# Patient Record
Sex: Male | Born: 1960 | Race: White | Hispanic: No | Marital: Single
Health system: Southern US, Community
[De-identification: ages and names within clinical notes are randomized; demographics above are authoritative.]

---

## 2009-10-20 ENCOUNTER — Inpatient Hospital Stay: Payer: Self-pay | Admitting: Internal Medicine

## 2009-11-26 ENCOUNTER — Ambulatory Visit: Payer: Self-pay | Admitting: Specialist

## 2010-01-06 ENCOUNTER — Ambulatory Visit: Payer: Self-pay | Admitting: Surgery

## 2010-01-31 ENCOUNTER — Ambulatory Visit: Payer: Self-pay | Admitting: Surgery

## 2010-12-08 ENCOUNTER — Ambulatory Visit: Payer: Self-pay | Admitting: Ophthalmology

## 2011-07-11 ENCOUNTER — Emergency Department: Payer: Self-pay | Admitting: Emergency Medicine

## 2011-07-11 LAB — BASIC METABOLIC PANEL
BUN: 38 mg/dL — ABNORMAL HIGH (ref 7–18)
Calcium, Total: 8.5 mg/dL (ref 8.5–10.1)
Co2: 25 mmol/L (ref 21–32)
Creatinine: 1.71 mg/dL — ABNORMAL HIGH (ref 0.60–1.30)
EGFR (Non-African Amer.): 45 — ABNORMAL LOW
Osmolality: 296 (ref 275–301)
Potassium: 4.9 mmol/L (ref 3.5–5.1)
Sodium: 132 mmol/L — ABNORMAL LOW (ref 136–145)

## 2011-07-11 LAB — URINALYSIS, COMPLETE
Bacteria: NONE SEEN
Glucose,UR: >=500 mg/dL (ref 0–75)
Ketone: NEGATIVE
Leukocyte Esterase: NEGATIVE
Nitrite: NEGATIVE
Protein: 100
Specific Gravity: 1.027 (ref 1.003–1.030)
WBC UR: <1 /HPF (ref 0–5)

## 2011-07-11 LAB — CBC
HGB: 13.6 g/dL (ref 13.0–18.0)
MCV: 79 fL — ABNORMAL LOW (ref 80–100)
Platelet: 266 10*3/uL (ref 150–440)
RDW: 14.5 % (ref 11.5–14.5)
WBC: 10.5 10*3/uL (ref 3.8–10.6)

## 2011-10-07 ENCOUNTER — Ambulatory Visit: Payer: Self-pay | Admitting: Internal Medicine

## 2011-10-20 ENCOUNTER — Ambulatory Visit: Payer: Self-pay | Admitting: Nephrology

## 2012-01-26 ENCOUNTER — Ambulatory Visit: Payer: Self-pay | Admitting: Pain Medicine

## 2012-02-06 ENCOUNTER — Emergency Department: Payer: Self-pay | Admitting: Emergency Medicine

## 2012-04-10 ENCOUNTER — Inpatient Hospital Stay: Payer: Self-pay | Admitting: Urology

## 2012-04-10 LAB — URINALYSIS, COMPLETE
Hyaline Cast: 7
Nitrite: NEGATIVE
Protein: >=500
RBC,UR: 4 /HPF (ref 0–5)
Specific Gravity: 1.013 (ref 1.003–1.030)

## 2012-04-10 LAB — CBC
HGB: 10.6 g/dL — ABNORMAL LOW (ref 13.0–18.0)
MCH: 25 pg — ABNORMAL LOW (ref 26.0–34.0)
MCV: 79 fL — ABNORMAL LOW (ref 80–100)
RBC: 4.22 10*6/uL — ABNORMAL LOW (ref 4.40–5.90)

## 2012-04-10 LAB — COMPREHENSIVE METABOLIC PANEL
Alkaline Phosphatase: 417 U/L — ABNORMAL HIGH (ref 50–136)
BUN: 46 mg/dL — ABNORMAL HIGH (ref 7–18)
Chloride: 101 mmol/L (ref 98–107)
Co2: 24 mmol/L (ref 21–32)
Creatinine: 3.83 mg/dL — ABNORMAL HIGH (ref 0.60–1.30)
Osmolality: 295 (ref 275–301)
Potassium: 3.6 mmol/L (ref 3.5–5.1)
SGOT(AST): 46 U/L — ABNORMAL HIGH (ref 15–37)
SGPT (ALT): 40 U/L (ref 12–78)
Sodium: 136 mmol/L (ref 136–145)
Total Protein: 7.6 g/dL (ref 6.4–8.2)

## 2012-04-10 LAB — PROTIME-INR
INR: 1.1
Prothrombin Time: 15 secs — ABNORMAL HIGH (ref 11.5–14.7)

## 2012-04-10 LAB — LIPASE, BLOOD: Lipase: 169 U/L (ref 73–393)

## 2012-04-11 LAB — BASIC METABOLIC PANEL
Anion Gap: 11 (ref 7–16)
BUN: 39 mg/dL — ABNORMAL HIGH (ref 7–18)
Calcium, Total: 6.3 mg/dL — CL (ref 8.5–10.1)
Chloride: 108 mmol/L — ABNORMAL HIGH (ref 98–107)
Co2: 20 mmol/L — ABNORMAL LOW (ref 21–32)
Creatinine: 3.73 mg/dL — ABNORMAL HIGH (ref 0.60–1.30)
EGFR (African American): 20 — ABNORMAL LOW
Sodium: 139 mmol/L (ref 136–145)

## 2012-04-11 LAB — CBC WITH DIFFERENTIAL/PLATELET
Basophil %: 0.4 %
Eosinophil #: 0.4 10*3/uL (ref 0.0–0.7)
Eosinophil %: 2.4 %
HCT: 26.7 % — ABNORMAL LOW (ref 40.0–52.0)
Lymphocyte %: 8 %
MCHC: 33.1 g/dL (ref 32.0–36.0)
Monocyte %: 10 %
Neutrophil #: 14 10*3/uL — ABNORMAL HIGH (ref 1.4–6.5)
Neutrophil %: 79.2 %
RBC: 3.38 10*6/uL — ABNORMAL LOW (ref 4.40–5.90)

## 2012-04-11 LAB — HEPATIC FUNCTION PANEL A (ARMC)
Albumin: 1.2 g/dL — ABNORMAL LOW (ref 3.4–5.0)
Alkaline Phosphatase: 327 U/L — ABNORMAL HIGH (ref 50–136)
Bilirubin,Total: 0.3 mg/dL (ref 0.2–1.0)
SGOT(AST): 21 U/L (ref 15–37)
SGPT (ALT): 29 U/L (ref 12–78)
Total Protein: 5.7 g/dL — ABNORMAL LOW (ref 6.4–8.2)

## 2012-04-12 LAB — BASIC METABOLIC PANEL
BUN: 35 mg/dL — ABNORMAL HIGH (ref 7–18)
Calcium, Total: 5.9 mg/dL — CL (ref 8.5–10.1)
Chloride: 109 mmol/L — ABNORMAL HIGH (ref 98–107)
EGFR (Non-African Amer.): 18 — ABNORMAL LOW
Glucose: 183 mg/dL — ABNORMAL HIGH (ref 65–99)
Potassium: 3.5 mmol/L (ref 3.5–5.1)
Sodium: 142 mmol/L (ref 136–145)

## 2012-04-12 LAB — CBC WITH DIFFERENTIAL/PLATELET
Basophil #: 0.1 10*3/uL (ref 0.0–0.1)
Eosinophil #: 0.5 10*3/uL (ref 0.0–0.7)
HCT: 26 % — ABNORMAL LOW (ref 40.0–52.0)
Lymphocyte #: 1.2 10*3/uL (ref 1.0–3.6)
MCH: 24 pg — ABNORMAL LOW (ref 26.0–34.0)
MCHC: 30.3 g/dL — ABNORMAL LOW (ref 32.0–36.0)
MCV: 79 fL — ABNORMAL LOW (ref 80–100)
Monocyte #: 1.7 x10 3/mm — ABNORMAL HIGH (ref 0.2–1.0)
Neutrophil %: 82 %
Platelet: 399 10*3/uL (ref 150–440)
RDW: 14.1 % (ref 11.5–14.5)
WBC: 19.3 10*3/uL — ABNORMAL HIGH (ref 3.8–10.6)

## 2012-04-12 LAB — PROTEIN / CREATININE RATIO, URINE
Protein, Random Urine: 376 mg/dL — ABNORMAL HIGH (ref 0–12)
Protein/Creat. Ratio: 9126 mg/gCREAT — ABNORMAL HIGH (ref 0–200)

## 2012-04-13 LAB — BASIC METABOLIC PANEL
Anion Gap: 9 (ref 7–16)
BUN: 33 mg/dL — ABNORMAL HIGH (ref 7–18)
Chloride: 115 mmol/L — ABNORMAL HIGH (ref 98–107)
Creatinine: 3.4 mg/dL — ABNORMAL HIGH (ref 0.60–1.30)
Glucose: 179 mg/dL — ABNORMAL HIGH (ref 65–99)
Osmolality: 297 (ref 275–301)
Potassium: 4 mmol/L (ref 3.5–5.1)
Sodium: 143 mmol/L (ref 136–145)

## 2012-04-13 LAB — CBC WITH DIFFERENTIAL/PLATELET
Basophil %: 0.4 %
Eosinophil %: 3.1 %
HGB: 8.1 g/dL — ABNORMAL LOW (ref 13.0–18.0)
Lymphocyte %: 5.3 %
MCH: 25.4 pg — ABNORMAL LOW (ref 26.0–34.0)
MCHC: 31.8 g/dL — ABNORMAL LOW (ref 32.0–36.0)
Monocyte #: 1.4 x10 3/mm — ABNORMAL HIGH (ref 0.2–1.0)
Monocyte %: 7.6 %
Neutrophil %: 83.6 %
RBC: 3.18 10*6/uL — ABNORMAL LOW (ref 4.40–5.90)
WBC: 17.9 10*3/uL — ABNORMAL HIGH (ref 3.8–10.6)

## 2012-04-14 LAB — CBC WITH DIFFERENTIAL/PLATELET
Basophil #: 0.1 10*3/uL (ref 0.0–0.1)
Basophil %: 0.5 %
Eosinophil #: 0.4 10*3/uL (ref 0.0–0.7)
HCT: 26.4 % — ABNORMAL LOW (ref 40.0–52.0)
Lymphocyte #: 1.6 10*3/uL (ref 1.0–3.6)
MCH: 28.5 pg (ref 26.0–34.0)
MCHC: 35.3 g/dL (ref 32.0–36.0)
MCV: 81 fL (ref 80–100)
Monocyte #: 1.3 x10 3/mm — ABNORMAL HIGH (ref 0.2–1.0)
Monocyte %: 7.5 %
Neutrophil #: 14.1 10*3/uL — ABNORMAL HIGH (ref 1.4–6.5)
RDW: 15.1 % — ABNORMAL HIGH (ref 11.5–14.5)

## 2012-04-14 LAB — BASIC METABOLIC PANEL
Anion Gap: 11 (ref 7–16)
BUN: 30 mg/dL — ABNORMAL HIGH (ref 7–18)
Chloride: 116 mmol/L — ABNORMAL HIGH (ref 98–107)
Co2: 17 mmol/L — ABNORMAL LOW (ref 21–32)
Creatinine: 3.21 mg/dL — ABNORMAL HIGH (ref 0.60–1.30)
EGFR (Non-African Amer.): 21 — ABNORMAL LOW
Glucose: 154 mg/dL — ABNORMAL HIGH (ref 65–99)
Sodium: 144 mmol/L (ref 136–145)

## 2012-04-14 LAB — HEMOGLOBIN A1C

## 2012-04-14 LAB — CLOSTRIDIUM DIFFICILE BY PCR

## 2012-04-15 ENCOUNTER — Ambulatory Visit: Payer: Self-pay

## 2012-04-15 LAB — CBC WITH DIFFERENTIAL/PLATELET
Basophil #: 0.1 10*3/uL (ref 0.0–0.1)
Eosinophil #: 0.5 10*3/uL (ref 0.0–0.7)
HCT: 26.2 % — ABNORMAL LOW (ref 40.0–52.0)
Lymphocyte #: 1.8 10*3/uL (ref 1.0–3.6)
Lymphocyte %: 11.3 %
MCH: 25.5 pg — ABNORMAL LOW (ref 26.0–34.0)
MCHC: 31.8 g/dL — ABNORMAL LOW (ref 32.0–36.0)
Monocyte #: 1.3 x10 3/mm — ABNORMAL HIGH (ref 0.2–1.0)
Neutrophil #: 12.2 10*3/uL — ABNORMAL HIGH (ref 1.4–6.5)
Neutrophil %: 77 %
RDW: 14.8 % — ABNORMAL HIGH (ref 11.5–14.5)

## 2012-04-15 LAB — BASIC METABOLIC PANEL
Anion Gap: 11 (ref 7–16)
BUN: 29 mg/dL — ABNORMAL HIGH (ref 7–18)
Calcium, Total: 5.7 mg/dL — CL (ref 8.5–10.1)
Co2: 17 mmol/L — ABNORMAL LOW (ref 21–32)
Creatinine: 2.74 mg/dL — ABNORMAL HIGH (ref 0.60–1.30)
EGFR (African American): 29 — ABNORMAL LOW
Potassium: 4 mmol/L (ref 3.5–5.1)
Sodium: 145 mmol/L (ref 136–145)

## 2012-04-15 LAB — WOUND CULTURE

## 2012-04-16 LAB — BASIC METABOLIC PANEL WITH GFR
Anion Gap: 11
BUN: 27 mg/dL — ABNORMAL HIGH
Calcium, Total: 6.1 mg/dL — CL
Chloride: 115 mmol/L — ABNORMAL HIGH
Co2: 15 mmol/L — ABNORMAL LOW
Creatinine: 2.62 mg/dL — ABNORMAL HIGH
EGFR (African American): 31 — ABNORMAL LOW
EGFR (Non-African Amer.): 27 — ABNORMAL LOW
Glucose: 144 mg/dL — ABNORMAL HIGH
Osmolality: 289
Potassium: 4.8 mmol/L
Sodium: 141 mmol/L

## 2012-04-16 LAB — CBC WITH DIFFERENTIAL/PLATELET
Basophil #: 0.2 10*3/uL — ABNORMAL HIGH (ref 0.0–0.1)
Eosinophil %: 2.3 %
Lymphocyte #: 2.1 10*3/uL (ref 1.0–3.6)
Lymphocyte %: 8.7 %
MCH: 24.9 pg — ABNORMAL LOW (ref 26.0–34.0)
Monocyte #: 1.7 x10 3/mm — ABNORMAL HIGH (ref 0.2–1.0)
Monocyte %: 7 %
Neutrophil %: 81.3 %
Platelet: 622 10*3/uL — ABNORMAL HIGH (ref 150–440)
RBC: 3.77 10*6/uL — ABNORMAL LOW (ref 4.40–5.90)
RDW: 15.2 % — ABNORMAL HIGH (ref 11.5–14.5)
WBC: 24.1 10*3/uL — ABNORMAL HIGH (ref 3.8–10.6)

## 2012-04-17 LAB — BASIC METABOLIC PANEL
BUN: 25 mg/dL — ABNORMAL HIGH (ref 7–18)
Calcium, Total: 5.6 mg/dL — CL (ref 8.5–10.1)
Chloride: 114 mmol/L — ABNORMAL HIGH (ref 98–107)
EGFR (African American): 32 — ABNORMAL LOW
EGFR (Non-African Amer.): 28 — ABNORMAL LOW
Glucose: 124 mg/dL — ABNORMAL HIGH (ref 65–99)
Osmolality: 289 (ref 275–301)
Potassium: 4 mmol/L (ref 3.5–5.1)

## 2012-04-17 LAB — CBC WITH DIFFERENTIAL/PLATELET
Basophil #: 0.1 10*3/uL (ref 0.0–0.1)
Basophil %: 0.4 %
Eosinophil #: 0.4 10*3/uL (ref 0.0–0.7)
HCT: 23.8 % — ABNORMAL LOW (ref 40.0–52.0)
Lymphocyte #: 1.6 10*3/uL (ref 1.0–3.6)
Lymphocyte %: 9.3 %
MCH: 25.1 pg — ABNORMAL LOW (ref 26.0–34.0)
MCHC: 31.7 g/dL — ABNORMAL LOW (ref 32.0–36.0)
MCV: 79 fL — ABNORMAL LOW (ref 80–100)
Monocyte #: 1.4 x10 3/mm — ABNORMAL HIGH (ref 0.2–1.0)
Platelet: 533 10*3/uL — ABNORMAL HIGH (ref 150–440)
RDW: 14.9 % — ABNORMAL HIGH (ref 11.5–14.5)
WBC: 17.3 10*3/uL — ABNORMAL HIGH (ref 3.8–10.6)

## 2012-04-18 LAB — CBC WITH DIFFERENTIAL/PLATELET
Basophil %: 0.4 %
Eosinophil #: 0.4 10*3/uL (ref 0.0–0.7)
Eosinophil %: 2.2 %
HCT: 25.2 % — ABNORMAL LOW (ref 40.0–52.0)
HGB: 8.4 g/dL — ABNORMAL LOW (ref 13.0–18.0)
Lymphocyte #: 1.8 10*3/uL (ref 1.0–3.6)
Lymphocyte %: 10.5 %
MCH: 26.7 pg (ref 26.0–34.0)
Monocyte #: 1.2 x10 3/mm — ABNORMAL HIGH (ref 0.2–1.0)
Monocyte %: 7.2 %
Neutrophil #: 13.7 10*3/uL — ABNORMAL HIGH (ref 1.4–6.5)
RBC: 3.13 10*6/uL — ABNORMAL LOW (ref 4.40–5.90)
RDW: 15.2 % — ABNORMAL HIGH (ref 11.5–14.5)
WBC: 17.3 10*3/uL — ABNORMAL HIGH (ref 3.8–10.6)

## 2012-04-19 LAB — CBC WITH DIFFERENTIAL/PLATELET
Basophil %: 0.5 %
Eosinophil %: 1.9 %
HCT: 25.2 % — ABNORMAL LOW (ref 40.0–52.0)
HGB: 8.1 g/dL — ABNORMAL LOW (ref 13.0–18.0)
Lymphocyte #: 1.7 10*3/uL (ref 1.0–3.6)
MCH: 25.7 pg — ABNORMAL LOW (ref 26.0–34.0)
MCV: 80 fL (ref 80–100)
Monocyte #: 1.3 x10 3/mm — ABNORMAL HIGH (ref 0.2–1.0)
Monocyte %: 8 %
Neutrophil #: 12.8 10*3/uL — ABNORMAL HIGH (ref 1.4–6.5)
Platelet: 629 10*3/uL — ABNORMAL HIGH (ref 150–440)
RBC: 3.15 10*6/uL — ABNORMAL LOW (ref 4.40–5.90)
WBC: 16.1 10*3/uL — ABNORMAL HIGH (ref 3.8–10.6)

## 2012-04-19 LAB — BASIC METABOLIC PANEL
Calcium, Total: 5.8 mg/dL — CL (ref 8.5–10.1)
Co2: 20 mmol/L — ABNORMAL LOW (ref 21–32)
Creatinine: 2.25 mg/dL — ABNORMAL HIGH (ref 0.60–1.30)
EGFR (Non-African Amer.): 32 — ABNORMAL LOW
Glucose: 143 mg/dL — ABNORMAL HIGH (ref 65–99)
Osmolality: 293 (ref 275–301)
Potassium: 4.2 mmol/L (ref 3.5–5.1)
Sodium: 144 mmol/L (ref 136–145)

## 2012-04-20 LAB — CBC WITH DIFFERENTIAL/PLATELET
Basophil #: 0.1 10*3/uL (ref 0.0–0.1)
Basophil %: 0.6 %
Eosinophil #: 0.2 10*3/uL (ref 0.0–0.7)
HCT: 23.5 % — ABNORMAL LOW (ref 40.0–52.0)
Lymphocyte #: 1.9 10*3/uL (ref 1.0–3.6)
Lymphocyte %: 13 %
MCH: 25.1 pg — ABNORMAL LOW (ref 26.0–34.0)
MCHC: 31.7 g/dL — ABNORMAL LOW (ref 32.0–36.0)
MCV: 79 fL — ABNORMAL LOW (ref 80–100)
Monocyte #: 1.4 x10 3/mm — ABNORMAL HIGH (ref 0.2–1.0)
Neutrophil #: 10.7 10*3/uL — ABNORMAL HIGH (ref 1.4–6.5)
Platelet: 600 10*3/uL — ABNORMAL HIGH (ref 150–440)
RDW: 15.4 % — ABNORMAL HIGH (ref 11.5–14.5)
WBC: 14.2 10*3/uL — ABNORMAL HIGH (ref 3.8–10.6)

## 2012-04-20 LAB — BASIC METABOLIC PANEL
Calcium, Total: 5.8 mg/dL — CL (ref 8.5–10.1)
Co2: 22 mmol/L (ref 21–32)
EGFR (African American): 37 — ABNORMAL LOW
EGFR (Non-African Amer.): 32 — ABNORMAL LOW
Glucose: 143 mg/dL — ABNORMAL HIGH (ref 65–99)
Sodium: 144 mmol/L (ref 136–145)

## 2012-04-21 ENCOUNTER — Encounter: Payer: Self-pay | Admitting: Cardiothoracic Surgery

## 2012-04-21 ENCOUNTER — Encounter: Payer: Self-pay | Admitting: Nurse Practitioner

## 2012-04-29 ENCOUNTER — Emergency Department: Payer: Self-pay | Admitting: Emergency Medicine

## 2012-04-29 LAB — CBC
HCT: 28.8 % — ABNORMAL LOW (ref 40.0–52.0)
HGB: 8.7 g/dL — ABNORMAL LOW (ref 13.0–18.0)
MCV: 81 fL (ref 80–100)
Platelet: 440 10*3/uL (ref 150–440)
RBC: 3.58 10*6/uL — ABNORMAL LOW (ref 4.40–5.90)
WBC: 10.7 10*3/uL — ABNORMAL HIGH (ref 3.8–10.6)

## 2012-04-29 LAB — BASIC METABOLIC PANEL
Anion Gap: 6 — ABNORMAL LOW (ref 7–16)
BUN: 26 mg/dL — ABNORMAL HIGH (ref 7–18)
Chloride: 109 mmol/L — ABNORMAL HIGH (ref 98–107)
Creatinine: 2.38 mg/dL — ABNORMAL HIGH (ref 0.60–1.30)
EGFR (African American): 35 — ABNORMAL LOW
EGFR (Non-African Amer.): 30 — ABNORMAL LOW
Glucose: 197 mg/dL — ABNORMAL HIGH (ref 65–99)
Osmolality: 291 (ref 275–301)
Potassium: 4.8 mmol/L (ref 3.5–5.1)

## 2012-04-29 LAB — URINALYSIS, COMPLETE
Bilirubin,UR: NEGATIVE
Glucose,UR: >=500 mg/dL (ref 0–75)
Ketone: NEGATIVE
Squamous Epithelial: 2

## 2012-05-01 ENCOUNTER — Emergency Department: Payer: Self-pay | Admitting: Emergency Medicine

## 2012-05-03 ENCOUNTER — Encounter: Payer: Self-pay | Admitting: Nurse Practitioner

## 2012-05-03 ENCOUNTER — Encounter: Payer: Self-pay | Admitting: Cardiothoracic Surgery

## 2012-05-03 LAB — WOUND CULTURE

## 2012-05-18 ENCOUNTER — Emergency Department: Payer: Self-pay | Admitting: Unknown Physician Specialty

## 2012-06-03 ENCOUNTER — Encounter: Payer: Self-pay | Admitting: Cardiothoracic Surgery

## 2012-06-03 ENCOUNTER — Encounter: Payer: Self-pay | Admitting: Nurse Practitioner

## 2012-06-14 ENCOUNTER — Ambulatory Visit: Payer: Self-pay | Admitting: Family Medicine

## 2012-08-17 ENCOUNTER — Inpatient Hospital Stay: Payer: Self-pay | Admitting: Family Medicine

## 2012-08-17 LAB — CBC
HGB: 9.1 g/dL — ABNORMAL LOW (ref 13.0–18.0)
MCHC: 30.4 g/dL — ABNORMAL LOW (ref 32.0–36.0)
MCV: 76 fL — ABNORMAL LOW (ref 80–100)
RDW: 18.2 % — ABNORMAL HIGH (ref 11.5–14.5)
WBC: 18.5 10*3/uL — ABNORMAL HIGH (ref 3.8–10.6)

## 2012-08-17 LAB — URINALYSIS, COMPLETE
Bilirubin,UR: NEGATIVE
Leukocyte Esterase: NEGATIVE
Nitrite: NEGATIVE
Ph: 5 (ref 4.5–8.0)
RBC,UR: 9 /HPF (ref 0–5)
Squamous Epithelial: 1
WBC UR: 8 /HPF (ref 0–5)

## 2012-08-17 LAB — COMPREHENSIVE METABOLIC PANEL
Albumin: 1.6 g/dL — ABNORMAL LOW (ref 3.4–5.0)
Alkaline Phosphatase: 255 U/L — ABNORMAL HIGH (ref 50–136)
BUN: 100 mg/dL — ABNORMAL HIGH (ref 7–18)
Calcium, Total: 5.1 mg/dL — CL (ref 8.5–10.1)
Chloride: 101 mmol/L (ref 98–107)
EGFR (Non-African Amer.): 6 — ABNORMAL LOW
Glucose: 185 mg/dL — ABNORMAL HIGH (ref 65–99)
Osmolality: 306 (ref 275–301)
Potassium: 4.8 mmol/L (ref 3.5–5.1)
SGPT (ALT): 76 U/L (ref 12–78)
Sodium: 135 mmol/L — ABNORMAL LOW (ref 136–145)
Total Protein: 7.4 g/dL (ref 6.4–8.2)

## 2012-08-17 LAB — TROPONIN I: Troponin-I: <0.02 ng/mL

## 2012-08-17 LAB — APTT: Activated PTT: 41.1 secs — ABNORMAL HIGH (ref 23.6–35.9)

## 2012-08-18 LAB — BASIC METABOLIC PANEL
Anion Gap: 14 (ref 7–16)
BUN: 99 mg/dL — ABNORMAL HIGH (ref 7–18)
Calcium, Total: 5.3 mg/dL — CL (ref 8.5–10.1)
Chloride: 105 mmol/L (ref 98–107)
Co2: 19 mmol/L — ABNORMAL LOW (ref 21–32)
Creatinine: 8.73 mg/dL — ABNORMAL HIGH (ref 0.60–1.30)
EGFR (African American): 7 — ABNORMAL LOW
Osmolality: 306 (ref 275–301)
Potassium: 4.4 mmol/L (ref 3.5–5.1)
Sodium: 138 mmol/L (ref 136–145)

## 2012-08-18 LAB — URINALYSIS, COMPLETE
Glucose,UR: 150 mg/dL (ref 0–75)
Granular Cast: 9
Nitrite: NEGATIVE
Ph: 5 (ref 4.5–8.0)
Protein: >=500
RBC,UR: 5 /HPF (ref 0–5)
Specific Gravity: 1.016 (ref 1.003–1.030)
Squamous Epithelial: 1
WBC UR: 70 /HPF (ref 0–5)

## 2012-08-18 LAB — CBC WITH DIFFERENTIAL/PLATELET
Basophil %: 0.6 %
Eosinophil #: 0.4 10*3/uL (ref 0.0–0.7)
Eosinophil %: 2.6 %
Lymphocyte #: 1.2 10*3/uL (ref 1.0–3.6)
Lymphocyte %: 7.6 %
MCH: 22.4 pg — ABNORMAL LOW (ref 26.0–34.0)
MCHC: 30.6 g/dL — ABNORMAL LOW (ref 32.0–36.0)
MCV: 73 fL — ABNORMAL LOW (ref 80–100)
Monocyte #: 0.9 x10 3/mm (ref 0.2–1.0)
Neutrophil #: 13.5 10*3/uL — ABNORMAL HIGH (ref 1.4–6.5)

## 2012-08-18 LAB — PHOSPHORUS: Phosphorus: 7.1 mg/dL — ABNORMAL HIGH (ref 2.5–4.9)

## 2012-08-19 LAB — PROTEIN / CREATININE RATIO, URINE: Creatinine, Urine: 113.4 mg/dL (ref 30.0–125.0)

## 2012-08-19 LAB — CBC WITH DIFFERENTIAL/PLATELET
Basophil #: 0.1 10*3/uL (ref 0.0–0.1)
Basophil %: 0.8 %
Eosinophil #: 0.4 10*3/uL (ref 0.0–0.7)
Eosinophil %: 3.6 %
HCT: 22.9 % — ABNORMAL LOW (ref 40.0–52.0)
MCHC: 32.1 g/dL (ref 32.0–36.0)
Monocyte #: 1 x10 3/mm (ref 0.2–1.0)
Neutrophil #: 9.1 10*3/uL — ABNORMAL HIGH (ref 1.4–6.5)
Platelet: 466 10*3/uL — ABNORMAL HIGH (ref 150–440)
RBC: 3.15 10*6/uL — ABNORMAL LOW (ref 4.40–5.90)
WBC: 11.8 10*3/uL — ABNORMAL HIGH (ref 3.8–10.6)

## 2012-08-19 LAB — BASIC METABOLIC PANEL
Anion Gap: 13 (ref 7–16)
Calcium, Total: 5.3 mg/dL — CL (ref 8.5–10.1)
Chloride: 106 mmol/L (ref 98–107)
Co2: 20 mmol/L — ABNORMAL LOW (ref 21–32)
Creatinine: 6.56 mg/dL — ABNORMAL HIGH (ref 0.60–1.30)
EGFR (Non-African Amer.): 9 — ABNORMAL LOW
Glucose: 78 mg/dL (ref 65–99)
Sodium: 139 mmol/L (ref 136–145)

## 2012-08-19 LAB — PHOSPHORUS: Phosphorus: 5.3 mg/dL — ABNORMAL HIGH (ref 2.5–4.9)

## 2012-08-19 LAB — LIPID PANEL
Cholesterol: 121 mg/dL (ref 0–200)
HDL Cholesterol: 22 mg/dL — ABNORMAL LOW (ref 40–60)
Ldl Cholesterol, Calc: 71 mg/dL (ref 0–100)
VLDL Cholesterol, Calc: 28 mg/dL (ref 5–40)

## 2012-08-19 LAB — URINE CULTURE

## 2012-08-20 LAB — CBC WITH DIFFERENTIAL/PLATELET
Eosinophil #: 0.4 10*3/uL (ref 0.0–0.7)
Eosinophil %: 3.6 %
HCT: 24.2 % — ABNORMAL LOW (ref 40.0–52.0)
HGB: 7.8 g/dL — ABNORMAL LOW (ref 13.0–18.0)
Lymphocyte #: 1.2 10*3/uL (ref 1.0–3.6)
Lymphocyte %: 11 %
MCH: 23.6 pg — ABNORMAL LOW (ref 26.0–34.0)
MCV: 74 fL — ABNORMAL LOW (ref 80–100)
Monocyte #: 1.2 x10 3/mm — ABNORMAL HIGH (ref 0.2–1.0)
Neutrophil %: 73 %
Platelet: 514 10*3/uL — ABNORMAL HIGH (ref 150–440)
RBC: 3.29 10*6/uL — ABNORMAL LOW (ref 4.40–5.90)
RDW: 17.8 % — ABNORMAL HIGH (ref 11.5–14.5)

## 2012-08-20 LAB — IRON AND TIBC
Iron Bind.Cap.(Total): 146 ug/dL — ABNORMAL LOW (ref 250–450)
Iron: 20 ug/dL — ABNORMAL LOW (ref 65–175)
Unbound Iron-Bind.Cap.: 126 ug/dL

## 2012-08-21 LAB — CBC WITH DIFFERENTIAL/PLATELET
Eosinophil %: 5.2 %
HGB: 7.3 g/dL — ABNORMAL LOW (ref 13.0–18.0)
Lymphocyte #: 1.5 10*3/uL (ref 1.0–3.6)
MCH: 23.4 pg — ABNORMAL LOW (ref 26.0–34.0)
MCHC: 31.8 g/dL — ABNORMAL LOW (ref 32.0–36.0)
MCV: 74 fL — ABNORMAL LOW (ref 80–100)
Monocyte #: 1 x10 3/mm (ref 0.2–1.0)
Monocyte %: 11.8 %
Neutrophil #: 5.7 10*3/uL (ref 1.4–6.5)
Neutrophil %: 64.7 %
Platelet: 505 10*3/uL — ABNORMAL HIGH (ref 150–440)
RBC: 3.12 10*6/uL — ABNORMAL LOW (ref 4.40–5.90)

## 2012-08-21 LAB — BASIC METABOLIC PANEL
Anion Gap: 9 (ref 7–16)
Anion Gap: 11 (ref 7–16)
Calcium, Total: 5.7 mg/dL — CL (ref 8.5–10.1)
Co2: 24 mmol/L (ref 21–32)
Creatinine: 5.14 mg/dL — ABNORMAL HIGH (ref 0.60–1.30)
Creatinine: 5.24 mg/dL — ABNORMAL HIGH (ref 0.60–1.30)
EGFR (African American): 13 — ABNORMAL LOW
EGFR (Non-African Amer.): 12 — ABNORMAL LOW
EGFR (Non-African Amer.): 12 — ABNORMAL LOW
Glucose: 156 mg/dL — ABNORMAL HIGH (ref 65–99)
Osmolality: 294 (ref 275–301)
Potassium: 4.3 mmol/L (ref 3.5–5.1)
Sodium: 139 mmol/L (ref 136–145)

## 2012-08-21 LAB — HEMATOCRIT: HCT: 25.3 % — ABNORMAL LOW (ref 40.0–52.0)

## 2012-08-21 LAB — HEMOGLOBIN: HGB: 7.9 g/dL — ABNORMAL LOW (ref 13.0–18.0)

## 2012-08-22 LAB — CBC WITH DIFFERENTIAL/PLATELET
Basophil #: 0.1 10*3/uL (ref 0.0–0.1)
Basophil %: 1.4 %
Eosinophil #: 0.4 10*3/uL (ref 0.0–0.7)
HGB: 7.4 g/dL — ABNORMAL LOW (ref 13.0–18.0)
Lymphocyte %: 13.4 %
MCHC: 32 g/dL (ref 32.0–36.0)
Monocyte %: 12.1 %
Neutrophil #: 6.4 10*3/uL (ref 1.4–6.5)
Platelet: 496 10*3/uL — ABNORMAL HIGH (ref 150–440)
RDW: 17.6 % — ABNORMAL HIGH (ref 11.5–14.5)
WBC: 9.4 10*3/uL (ref 3.8–10.6)

## 2012-08-22 LAB — UR PROT ELECTROPHORESIS, URINE RANDOM

## 2012-08-22 LAB — PROTEIN ELECTROPHORESIS(ARMC)

## 2012-08-23 LAB — CBC WITH DIFFERENTIAL/PLATELET
Basophil #: 0.1 10*3/uL (ref 0.0–0.1)
Basophil %: 1.3 %
Eosinophil #: 0.5 10*3/uL (ref 0.0–0.7)
Eosinophil %: 8 %
HGB: 7.7 g/dL — ABNORMAL LOW (ref 13.0–18.0)
MCH: 23.6 pg — ABNORMAL LOW (ref 26.0–34.0)
MCHC: 32.1 g/dL (ref 32.0–36.0)
Monocyte #: 1.1 x10 3/mm — ABNORMAL HIGH (ref 0.2–1.0)
Neutrophil #: 3.5 10*3/uL (ref 1.4–6.5)
Platelet: 544 10*3/uL — ABNORMAL HIGH (ref 150–440)
RBC: 3.24 10*6/uL — ABNORMAL LOW (ref 4.40–5.90)
RDW: 17.5 % — ABNORMAL HIGH (ref 11.5–14.5)
WBC: 6.4 10*3/uL (ref 3.8–10.6)

## 2012-08-23 LAB — BASIC METABOLIC PANEL
BUN: 43 mg/dL — ABNORMAL HIGH (ref 7–18)
Calcium, Total: 6.2 mg/dL — CL (ref 8.5–10.1)
Creatinine: 5.31 mg/dL — ABNORMAL HIGH (ref 0.60–1.30)
EGFR (African American): 13 — ABNORMAL LOW
Glucose: 98 mg/dL (ref 65–99)
Osmolality: 286 (ref 275–301)
Sodium: 138 mmol/L (ref 136–145)

## 2012-08-23 LAB — PATHOLOGY REPORT

## 2012-08-24 LAB — PHOSPHORUS: Phosphorus: 4.4 mg/dL (ref 2.5–4.9)

## 2012-08-24 LAB — STOOL CULTURE

## 2012-08-25 LAB — CBC WITH DIFFERENTIAL/PLATELET
Basophil #: 0.1 10*3/uL (ref 0.0–0.1)
Basophil %: 1.7 %
Eosinophil #: 0.5 10*3/uL (ref 0.0–0.7)
HGB: 7.2 g/dL — ABNORMAL LOW (ref 13.0–18.0)
Lymphocyte #: 1.5 10*3/uL (ref 1.0–3.6)
Lymphocyte %: 17 %
MCH: 22.4 pg — ABNORMAL LOW (ref 26.0–34.0)
MCHC: 30.1 g/dL — ABNORMAL LOW (ref 32.0–36.0)
Monocyte %: 11.4 %
Neutrophil #: 5.5 10*3/uL (ref 1.4–6.5)
Neutrophil %: 64.5 %
Platelet: 446 10*3/uL — ABNORMAL HIGH (ref 150–440)
RBC: 3.23 10*6/uL — ABNORMAL LOW (ref 4.40–5.90)
WBC: 8.6 10*3/uL (ref 3.8–10.6)

## 2012-08-25 LAB — RENAL FUNCTION PANEL
Anion Gap: 4 — ABNORMAL LOW (ref 7–16)
BUN: 24 mg/dL — ABNORMAL HIGH (ref 7–18)
Chloride: 103 mmol/L (ref 98–107)
Co2: 32 mmol/L (ref 21–32)
Creatinine: 3.86 mg/dL — ABNORMAL HIGH (ref 0.60–1.30)
EGFR (African American): 19 — ABNORMAL LOW
EGFR (Non-African Amer.): 17 — ABNORMAL LOW
Osmolality: 284 (ref 275–301)

## 2012-09-13 ENCOUNTER — Ambulatory Visit: Payer: Self-pay | Admitting: Vascular Surgery

## 2012-09-13 LAB — BASIC METABOLIC PANEL
Calcium, Total: 7.4 mg/dL — ABNORMAL LOW (ref 8.5–10.1)
Chloride: 102 mmol/L (ref 98–107)
Creatinine: 5.6 mg/dL — ABNORMAL HIGH (ref 0.60–1.30)
EGFR (Non-African Amer.): 11 — ABNORMAL LOW
Glucose: 98 mg/dL (ref 65–99)

## 2012-09-13 LAB — CBC
HCT: 26.2 % — ABNORMAL LOW (ref 40.0–52.0)
HGB: 8.3 g/dL — ABNORMAL LOW (ref 13.0–18.0)
MCH: 22.9 pg — ABNORMAL LOW (ref 26.0–34.0)
MCHC: 31.6 g/dL — ABNORMAL LOW (ref 32.0–36.0)
Platelet: 518 10*3/uL — ABNORMAL HIGH (ref 150–440)
RBC: 3.62 10*6/uL — ABNORMAL LOW (ref 4.40–5.90)
RDW: 18 % — ABNORMAL HIGH (ref 11.5–14.5)
WBC: 10.7 10*3/uL — ABNORMAL HIGH (ref 3.8–10.6)

## 2012-09-20 ENCOUNTER — Ambulatory Visit: Payer: Self-pay | Admitting: Vascular Surgery

## 2012-09-21 ENCOUNTER — Inpatient Hospital Stay: Payer: Self-pay | Admitting: Family Medicine

## 2012-09-21 LAB — COMPREHENSIVE METABOLIC PANEL
Alkaline Phosphatase: 170 U/L — ABNORMAL HIGH (ref 50–136)
Bilirubin,Total: 0.2 mg/dL (ref 0.2–1.0)
Creatinine: 6.91 mg/dL — ABNORMAL HIGH (ref 0.60–1.30)
EGFR (African American): 10 — ABNORMAL LOW
Glucose: 142 mg/dL — ABNORMAL HIGH (ref 65–99)
Osmolality: 291 (ref 275–301)

## 2012-09-21 LAB — TROPONIN I
Troponin-I: 0.08 ng/mL — ABNORMAL HIGH
Troponin-I: 0.06 ng/mL — ABNORMAL HIGH

## 2012-09-21 LAB — CK TOTAL AND CKMB (NOT AT ARMC)
CK, Total: 81 U/L (ref 35–232)
CK-MB: 1.1 ng/mL (ref 0.5–3.6)
CK-MB: 0.9 ng/mL (ref 0.5–3.6)

## 2012-09-21 LAB — CBC
HCT: 27.7 % — ABNORMAL LOW (ref 40.0–52.0)
MCH: 21.8 pg — ABNORMAL LOW (ref 26.0–34.0)
Platelet: 475 10*3/uL — ABNORMAL HIGH (ref 150–440)
WBC: 27.6 10*3/uL — ABNORMAL HIGH (ref 3.8–10.6)

## 2012-09-22 LAB — CBC WITH DIFFERENTIAL/PLATELET
Eosinophil #: 0.5 10*3/uL (ref 0.0–0.7)
Eosinophil %: 5.7 %
HCT: 23.4 % — ABNORMAL LOW (ref 40.0–52.0)
Lymphocyte #: 1.8 10*3/uL (ref 1.0–3.6)
MCH: 22.6 pg — ABNORMAL LOW (ref 26.0–34.0)
MCV: 72 fL — ABNORMAL LOW (ref 80–100)
Monocyte %: 8.1 %
Neutrophil #: 6.2 10*3/uL (ref 1.4–6.5)
Platelet: 419 10*3/uL (ref 150–440)
RDW: 18.3 % — ABNORMAL HIGH (ref 11.5–14.5)
WBC: 9.4 10*3/uL (ref 3.8–10.6)

## 2012-09-22 LAB — CK TOTAL AND CKMB (NOT AT ARMC)
CK, Total: 87 U/L (ref 35–232)
CK-MB: 0.9 ng/mL (ref 0.5–3.6)

## 2012-09-22 LAB — BASIC METABOLIC PANEL
Anion Gap: 8 (ref 7–16)
Calcium, Total: 6.8 mg/dL — CL (ref 8.5–10.1)
Chloride: 103 mmol/L (ref 98–107)
Potassium: 4 mmol/L (ref 3.5–5.1)

## 2012-09-22 LAB — TROPONIN I: Troponin-I: 0.04 ng/mL

## 2012-09-27 LAB — CULTURE, BLOOD (SINGLE)

## 2012-10-27 ENCOUNTER — Encounter (INDEPENDENT_AMBULATORY_CARE_PROVIDER_SITE_OTHER): Payer: Medicare Other | Admitting: Ophthalmology

## 2012-10-27 DIAGNOSIS — E11319 Type 2 diabetes mellitus with unspecified diabetic retinopathy without macular edema: Secondary | ICD-10-CM

## 2012-10-27 DIAGNOSIS — H43819 Vitreous degeneration, unspecified eye: Secondary | ICD-10-CM

## 2012-10-27 DIAGNOSIS — E11359 Type 2 diabetes mellitus with proliferative diabetic retinopathy without macular edema: Secondary | ICD-10-CM

## 2012-10-27 DIAGNOSIS — E11311 Type 2 diabetes mellitus with unspecified diabetic retinopathy with macular edema: Secondary | ICD-10-CM

## 2012-10-27 DIAGNOSIS — E1165 Type 2 diabetes mellitus with hyperglycemia: Secondary | ICD-10-CM

## 2012-10-30 ENCOUNTER — Encounter (INDEPENDENT_AMBULATORY_CARE_PROVIDER_SITE_OTHER): Payer: Medicare Other | Admitting: Ophthalmology

## 2012-10-30 DIAGNOSIS — H43819 Vitreous degeneration, unspecified eye: Secondary | ICD-10-CM

## 2012-10-30 DIAGNOSIS — E1165 Type 2 diabetes mellitus with hyperglycemia: Secondary | ICD-10-CM

## 2012-10-30 DIAGNOSIS — E11319 Type 2 diabetes mellitus with unspecified diabetic retinopathy without macular edema: Secondary | ICD-10-CM

## 2012-10-30 DIAGNOSIS — E11311 Type 2 diabetes mellitus with unspecified diabetic retinopathy with macular edema: Secondary | ICD-10-CM

## 2012-10-30 DIAGNOSIS — E11359 Type 2 diabetes mellitus with proliferative diabetic retinopathy without macular edema: Secondary | ICD-10-CM

## 2012-11-23 ENCOUNTER — Encounter (INDEPENDENT_AMBULATORY_CARE_PROVIDER_SITE_OTHER): Payer: Medicare Other | Admitting: Ophthalmology

## 2012-12-01 ENCOUNTER — Ambulatory Visit: Payer: Self-pay | Admitting: Internal Medicine

## 2012-12-04 ENCOUNTER — Encounter (INDEPENDENT_AMBULATORY_CARE_PROVIDER_SITE_OTHER): Payer: Medicare Other | Admitting: Ophthalmology

## 2012-12-04 DIAGNOSIS — H43819 Vitreous degeneration, unspecified eye: Secondary | ICD-10-CM

## 2012-12-04 DIAGNOSIS — E11319 Type 2 diabetes mellitus with unspecified diabetic retinopathy without macular edema: Secondary | ICD-10-CM

## 2012-12-04 DIAGNOSIS — E11311 Type 2 diabetes mellitus with unspecified diabetic retinopathy with macular edema: Secondary | ICD-10-CM

## 2012-12-04 DIAGNOSIS — E1139 Type 2 diabetes mellitus with other diabetic ophthalmic complication: Secondary | ICD-10-CM

## 2012-12-04 DIAGNOSIS — E11359 Type 2 diabetes mellitus with proliferative diabetic retinopathy without macular edema: Secondary | ICD-10-CM

## 2012-12-19 ENCOUNTER — Inpatient Hospital Stay: Payer: Self-pay | Admitting: Family Medicine

## 2012-12-19 LAB — DRUG SCREEN, URINE
Amphetamines, Ur Screen: NEGATIVE (ref ?–1000)
Barbiturates, Ur Screen: NEGATIVE (ref ?–200)
Opiate, Ur Screen: NEGATIVE (ref ?–300)
Phencyclidine (PCP) Ur S: NEGATIVE (ref ?–25)

## 2012-12-19 LAB — CK TOTAL AND CKMB (NOT AT ARMC)
CK, Total: 843 U/L — ABNORMAL HIGH (ref 35–232)
CK, Total: 570 U/L — ABNORMAL HIGH (ref 35–232)
CK-MB: 8.1 ng/mL — ABNORMAL HIGH (ref 0.5–3.6)

## 2012-12-19 LAB — COMPREHENSIVE METABOLIC PANEL
Albumin: 2.2 g/dL — ABNORMAL LOW (ref 3.4–5.0)
BUN: 136 mg/dL — ABNORMAL HIGH (ref 7–18)
Calcium, Total: 8 mg/dL — ABNORMAL LOW (ref 8.5–10.1)
Chloride: 101 mmol/L (ref 98–107)
Co2: 19 mmol/L — ABNORMAL LOW (ref 21–32)
Creatinine: 8.81 mg/dL — ABNORMAL HIGH (ref 0.60–1.30)
EGFR (African American): 7 — ABNORMAL LOW
Glucose: 78 mg/dL (ref 65–99)
Osmolality: 319 (ref 275–301)
Potassium: 4.8 mmol/L (ref 3.5–5.1)
SGOT(AST): 50 U/L — ABNORMAL HIGH (ref 15–37)
SGPT (ALT): 32 U/L (ref 12–78)

## 2012-12-19 LAB — URINALYSIS, COMPLETE
Bilirubin,UR: NEGATIVE
Nitrite: NEGATIVE
Protein: >=500
Squamous Epithelial: NONE SEEN

## 2012-12-19 LAB — PHOSPHORUS: Phosphorus: 7.2 mg/dL — ABNORMAL HIGH (ref 2.5–4.9)

## 2012-12-19 LAB — AMMONIA: Ammonia, Plasma: <25 mcmol/L (ref 11–32)

## 2012-12-19 LAB — PROTIME-INR: Prothrombin Time: 17.5 secs — ABNORMAL HIGH (ref 11.5–14.7)

## 2012-12-19 LAB — CBC
HGB: 12.4 g/dL — ABNORMAL LOW (ref 13.0–18.0)
MCH: 25.1 pg — ABNORMAL LOW (ref 26.0–34.0)
MCV: 78 fL — ABNORMAL LOW (ref 80–100)
RBC: 4.92 10*6/uL (ref 4.40–5.90)
RDW: 21.9 % — ABNORMAL HIGH (ref 11.5–14.5)

## 2012-12-19 LAB — TROPONIN I: Troponin-I: <0.02 ng/mL

## 2012-12-20 LAB — COMPREHENSIVE METABOLIC PANEL
Albumin: 1.7 g/dL — ABNORMAL LOW (ref 3.4–5.0)
Alkaline Phosphatase: 145 U/L — ABNORMAL HIGH (ref 50–136)
BUN: 114 mg/dL — ABNORMAL HIGH (ref 7–18)
Bilirubin,Total: 0.8 mg/dL (ref 0.2–1.0)
Calcium, Total: 7.6 mg/dL — ABNORMAL LOW (ref 8.5–10.1)
Chloride: 102 mmol/L (ref 98–107)
Co2: 24 mmol/L (ref 21–32)
Creatinine: 7.52 mg/dL — ABNORMAL HIGH (ref 0.60–1.30)
EGFR (African American): 9 — ABNORMAL LOW
Osmolality: 317 (ref 275–301)
Potassium: 3.7 mmol/L (ref 3.5–5.1)
SGPT (ALT): 21 U/L (ref 12–78)
Total Protein: 6.2 g/dL — ABNORMAL LOW (ref 6.4–8.2)

## 2012-12-20 LAB — MAGNESIUM: Magnesium: 2.2 mg/dL

## 2012-12-20 LAB — CBC WITH DIFFERENTIAL/PLATELET
Basophil #: 0.1 10*3/uL (ref 0.0–0.1)
Basophil %: 0.5 %
Eosinophil #: 0.1 10*3/uL (ref 0.0–0.7)
Eosinophil %: 0.5 %
HGB: 9.7 g/dL — ABNORMAL LOW (ref 13.0–18.0)
Lymphocyte #: 2.1 10*3/uL (ref 1.0–3.6)
Monocyte #: 1.1 x10 3/mm — ABNORMAL HIGH (ref 0.2–1.0)
Monocyte %: 9.5 %
Neutrophil #: 8.5 10*3/uL — ABNORMAL HIGH (ref 1.4–6.5)
Neutrophil %: 71.9 %
Platelet: 253 10*3/uL (ref 150–440)
RBC: 3.81 10*6/uL — ABNORMAL LOW (ref 4.40–5.90)

## 2012-12-20 LAB — HEMOGLOBIN A1C: Hemoglobin A1C: 5.4 % (ref 4.2–6.3)

## 2012-12-20 LAB — LIPID PANEL
Ldl Cholesterol, Calc: 58 mg/dL (ref 0–100)
Triglycerides: 290 mg/dL — ABNORMAL HIGH (ref 0–200)
VLDL Cholesterol, Calc: 58 mg/dL — ABNORMAL HIGH (ref 5–40)

## 2012-12-20 LAB — PHOSPHORUS: Phosphorus: 5.4 mg/dL — ABNORMAL HIGH (ref 2.5–4.9)

## 2012-12-20 LAB — URINE CULTURE

## 2012-12-20 LAB — TSH: Thyroid Stimulating Horm: 1.99 u[IU]/mL

## 2012-12-20 LAB — CK TOTAL AND CKMB (NOT AT ARMC): CK, Total: 421 U/L — ABNORMAL HIGH (ref 35–232)

## 2012-12-21 LAB — CBC WITH DIFFERENTIAL/PLATELET
Basophil %: 0.9 %
Eosinophil %: 3 %
HCT: 29.4 % — ABNORMAL LOW (ref 40.0–52.0)
HGB: 9.7 g/dL — ABNORMAL LOW (ref 13.0–18.0)
MCV: 77 fL — ABNORMAL LOW (ref 80–100)
Monocyte %: 15.9 %
Neutrophil %: 60.5 %
RDW: 21.4 % — ABNORMAL HIGH (ref 11.5–14.5)
WBC: 8 10*3/uL (ref 3.8–10.6)

## 2012-12-21 LAB — RENAL FUNCTION PANEL
Albumin: 1.7 g/dL — ABNORMAL LOW (ref 3.4–5.0)
Anion Gap: 10 (ref 7–16)
Calcium, Total: 7.3 mg/dL — ABNORMAL LOW (ref 8.5–10.1)
Co2: 28 mmol/L (ref 21–32)
EGFR (Non-African Amer.): 10 — ABNORMAL LOW
Osmolality: 292 (ref 275–301)
Potassium: 3.5 mmol/L (ref 3.5–5.1)
Sodium: 136 mmol/L (ref 136–145)

## 2012-12-21 LAB — PHOSPHORUS: Phosphorus: 3.8 mg/dL (ref 2.5–4.9)

## 2012-12-22 LAB — CBC WITH DIFFERENTIAL/PLATELET
Basophil #: 0.1 10*3/uL (ref 0.0–0.1)
Basophil %: 0.3 %
Eosinophil #: 0.1 10*3/uL (ref 0.0–0.7)
Eosinophil %: 0.6 %
HCT: 36.2 % — ABNORMAL LOW (ref 40.0–52.0)
HGB: 11.2 g/dL — ABNORMAL LOW (ref 13.0–18.0)
Lymphocyte #: 1.7 10*3/uL (ref 1.0–3.6)
MCHC: 30.9 g/dL — ABNORMAL LOW (ref 32.0–36.0)
MCV: 80 fL (ref 80–100)
Monocyte #: 2.6 x10 3/mm — ABNORMAL HIGH (ref 0.2–1.0)
Neutrophil #: 15.8 10*3/uL — ABNORMAL HIGH (ref 1.4–6.5)
Neutrophil %: 77.8 %
RBC: 4.51 10*6/uL (ref 4.40–5.90)
RDW: 21.1 % — ABNORMAL HIGH (ref 11.5–14.5)
WBC: 20.8 10*3/uL — ABNORMAL HIGH (ref 3.8–10.6)

## 2012-12-22 LAB — PHOSPHORUS: Phosphorus: 6.8 mg/dL — ABNORMAL HIGH (ref 2.5–4.9)

## 2012-12-22 LAB — RENAL FUNCTION PANEL
Albumin: 2.1 g/dL — ABNORMAL LOW (ref 3.4–5.0)
Anion Gap: 15 (ref 7–16)
Chloride: 95 mmol/L — ABNORMAL LOW (ref 98–107)
Co2: 26 mmol/L (ref 21–32)
EGFR (African American): 18 — ABNORMAL LOW
Glucose: 131 mg/dL — ABNORMAL HIGH (ref 65–99)
Phosphorus: 6.3 mg/dL — ABNORMAL HIGH (ref 2.5–4.9)
Potassium: 4.1 mmol/L (ref 3.5–5.1)

## 2012-12-22 LAB — MAGNESIUM: Magnesium: 1.9 mg/dL

## 2012-12-23 LAB — CBC WITH DIFFERENTIAL/PLATELET
Basophil #: 0.1 10*3/uL (ref 0.0–0.1)
Basophil %: 0.5 %
Eosinophil #: 0.3 10*3/uL (ref 0.0–0.7)
Eosinophil %: 1.6 %
HCT: 30.2 % — ABNORMAL LOW (ref 40.0–52.0)
HGB: 9.6 g/dL — ABNORMAL LOW (ref 13.0–18.0)
Lymphocyte #: 2.4 10*3/uL (ref 1.0–3.6)
Lymphocyte %: 14.2 %
MCH: 25.5 pg — ABNORMAL LOW (ref 26.0–34.0)
Neutrophil #: 12.2 10*3/uL — ABNORMAL HIGH (ref 1.4–6.5)
Neutrophil %: 72.7 %
RBC: 3.78 10*6/uL — ABNORMAL LOW (ref 4.40–5.90)
RDW: 21.4 % — ABNORMAL HIGH (ref 11.5–14.5)
WBC: 16.8 10*3/uL — ABNORMAL HIGH (ref 3.8–10.6)

## 2012-12-23 LAB — MAGNESIUM: Magnesium: 1.8 mg/dL

## 2012-12-23 LAB — RENAL FUNCTION PANEL
Albumin: 1.7 g/dL — ABNORMAL LOW (ref 3.4–5.0)
Anion Gap: 14 (ref 7–16)
Calcium, Total: 7.7 mg/dL — ABNORMAL LOW (ref 8.5–10.1)
Chloride: 94 mmol/L — ABNORMAL LOW (ref 98–107)
Co2: 26 mmol/L (ref 21–32)
Creatinine: 3.44 mg/dL — ABNORMAL HIGH (ref 0.60–1.30)
EGFR (African American): 22 — ABNORMAL LOW
EGFR (Non-African Amer.): 19 — ABNORMAL LOW
Phosphorus: 3.5 mg/dL (ref 2.5–4.9)
Potassium: 3.2 mmol/L — ABNORMAL LOW (ref 3.5–5.1)

## 2012-12-24 LAB — POTASSIUM: Potassium: 4.2 mmol/L (ref 3.5–5.1)

## 2012-12-24 LAB — BASIC METABOLIC PANEL
Anion Gap: 12 (ref 7–16)
Calcium, Total: 7.9 mg/dL — ABNORMAL LOW (ref 8.5–10.1)
Chloride: 94 mmol/L — ABNORMAL LOW (ref 98–107)
Co2: 28 mmol/L (ref 21–32)
Creatinine: 3.05 mg/dL — ABNORMAL HIGH (ref 0.60–1.30)
EGFR (African American): 26 — ABNORMAL LOW
EGFR (Non-African Amer.): 22 — ABNORMAL LOW
Osmolality: 280 (ref 275–301)
Sodium: 134 mmol/L — ABNORMAL LOW (ref 136–145)

## 2012-12-24 LAB — MAGNESIUM: Magnesium: 1.9 mg/dL

## 2012-12-24 LAB — CULTURE, BLOOD (SINGLE)

## 2012-12-24 LAB — EXPECTORATED SPUTUM ASSESSMENT W GRAM STAIN, RFLX TO RESP C

## 2012-12-24 LAB — PHOSPHORUS: Phosphorus: 2.7 mg/dL (ref 2.5–4.9)

## 2012-12-25 LAB — CBC WITH DIFFERENTIAL/PLATELET
Basophil #: 0.1 10*3/uL (ref 0.0–0.1)
Basophil %: 0.9 %
Eosinophil #: 0.3 10*3/uL (ref 0.0–0.7)
Eosinophil %: 2.3 %
HCT: 31.1 % — ABNORMAL LOW (ref 40.0–52.0)
HGB: 10.1 g/dL — ABNORMAL LOW (ref 13.0–18.0)
Lymphocyte #: 1.9 10*3/uL (ref 1.0–3.6)
MCH: 25.9 pg — ABNORMAL LOW (ref 26.0–34.0)
MCHC: 32.4 g/dL (ref 32.0–36.0)
Monocyte #: 1.2 x10 3/mm — ABNORMAL HIGH (ref 0.2–1.0)
Monocyte %: 10.1 %
Neutrophil #: 8 10*3/uL — ABNORMAL HIGH (ref 1.4–6.5)
Neutrophil %: 69.7 %
Platelet: 181 10*3/uL (ref 150–440)
RBC: 3.88 10*6/uL — ABNORMAL LOW (ref 4.40–5.90)
RDW: 22 % — ABNORMAL HIGH (ref 11.5–14.5)
WBC: 11.5 10*3/uL — ABNORMAL HIGH (ref 3.8–10.6)

## 2012-12-25 LAB — BASIC METABOLIC PANEL
Anion Gap: 13 (ref 7–16)
BUN: 46 mg/dL — ABNORMAL HIGH (ref 7–18)
Calcium, Total: 8 mg/dL — ABNORMAL LOW (ref 8.5–10.1)
Co2: 25 mmol/L (ref 21–32)
Creatinine: 4.1 mg/dL — ABNORMAL HIGH (ref 0.60–1.30)
EGFR (African American): 18 — ABNORMAL LOW
Glucose: 245 mg/dL — ABNORMAL HIGH (ref 65–99)

## 2012-12-25 LAB — MAGNESIUM: Magnesium: 1.9 mg/dL

## 2012-12-25 LAB — PHOSPHORUS: Phosphorus: 2.8 mg/dL (ref 2.5–4.9)

## 2012-12-26 LAB — CBC WITH DIFFERENTIAL/PLATELET
Basophil #: 0.1 10*3/uL (ref 0.0–0.1)
Basophil %: 0.7 %
Eosinophil #: 0.3 10*3/uL (ref 0.0–0.7)
Eosinophil %: 2.7 %
HGB: 10.5 g/dL — ABNORMAL LOW (ref 13.0–18.0)
MCH: 25.8 pg — ABNORMAL LOW (ref 26.0–34.0)
Monocyte %: 10.4 %
Neutrophil #: 8.8 10*3/uL — ABNORMAL HIGH (ref 1.4–6.5)
Neutrophil %: 69.4 %
RDW: 21.8 % — ABNORMAL HIGH (ref 11.5–14.5)
WBC: 12.7 10*3/uL — ABNORMAL HIGH (ref 3.8–10.6)

## 2012-12-26 LAB — RENAL FUNCTION PANEL
Anion Gap: 14 (ref 7–16)
Calcium, Total: 7.9 mg/dL — ABNORMAL LOW (ref 8.5–10.1)
Chloride: 92 mmol/L — ABNORMAL LOW (ref 98–107)
Co2: 24 mmol/L (ref 21–32)
Creatinine: 5.36 mg/dL — ABNORMAL HIGH (ref 0.60–1.30)
EGFR (African American): 13 — ABNORMAL LOW
EGFR (Non-African Amer.): 11 — ABNORMAL LOW
Osmolality: 287 (ref 275–301)
Phosphorus: 5.4 mg/dL — ABNORMAL HIGH (ref 2.5–4.9)
Potassium: 3.7 mmol/L (ref 3.5–5.1)
Sodium: 130 mmol/L — ABNORMAL LOW (ref 136–145)

## 2012-12-27 LAB — RENAL FUNCTION PANEL
Albumin: 1.9 g/dL — ABNORMAL LOW
Anion Gap: 10
BUN: 40 mg/dL — ABNORMAL HIGH
Calcium, Total: 8.1 mg/dL — ABNORMAL LOW
Chloride: 95 mmol/L — ABNORMAL LOW
Co2: 27 mmol/L
Creatinine: 3.57 mg/dL — ABNORMAL HIGH
EGFR (African American): 21 — ABNORMAL LOW
EGFR (Non-African Amer.): 18 — ABNORMAL LOW
Glucose: 168 mg/dL — ABNORMAL HIGH
Osmolality: 278
Phosphorus: 4.9 mg/dL
Potassium: 3.7 mmol/L
Sodium: 132 mmol/L — ABNORMAL LOW

## 2012-12-27 LAB — TRIGLYCERIDES: Triglycerides: 407 mg/dL — ABNORMAL HIGH

## 2012-12-28 ENCOUNTER — Encounter (INDEPENDENT_AMBULATORY_CARE_PROVIDER_SITE_OTHER): Payer: Medicare Other | Admitting: Ophthalmology

## 2012-12-28 LAB — CBC WITH DIFFERENTIAL/PLATELET
Eosinophil #: 0.2 10*3/uL (ref 0.0–0.7)
HCT: 32.7 % — ABNORMAL LOW (ref 40.0–52.0)
HGB: 10.4 g/dL — ABNORMAL LOW (ref 13.0–18.0)
MCV: 81 fL (ref 80–100)
Monocyte %: 9.2 %
Neutrophil #: 10.4 10*3/uL — ABNORMAL HIGH (ref 1.4–6.5)
Neutrophil %: 78.8 %
RBC: 4.05 10*6/uL — ABNORMAL LOW (ref 4.40–5.90)
RDW: 21.6 % — ABNORMAL HIGH (ref 11.5–14.5)
WBC: 13.1 10*3/uL — ABNORMAL HIGH (ref 3.8–10.6)

## 2012-12-28 LAB — RENAL FUNCTION PANEL
Creatinine: 5.41 mg/dL — ABNORMAL HIGH (ref 0.60–1.30)
Glucose: 158 mg/dL — ABNORMAL HIGH (ref 65–99)
Osmolality: 290 (ref 275–301)
Potassium: 3.7 mmol/L (ref 3.5–5.1)

## 2012-12-29 ENCOUNTER — Encounter (INDEPENDENT_AMBULATORY_CARE_PROVIDER_SITE_OTHER): Payer: Medicare Other | Admitting: Ophthalmology

## 2012-12-29 LAB — BASIC METABOLIC PANEL
Anion Gap: 12 (ref 7–16)
BUN: 33 mg/dL — ABNORMAL HIGH (ref 7–18)
Calcium, Total: 8.5 mg/dL (ref 8.5–10.1)
Chloride: 97 mmol/L — ABNORMAL LOW (ref 98–107)
Creatinine: 3.76 mg/dL — ABNORMAL HIGH (ref 0.60–1.30)
Glucose: 109 mg/dL — ABNORMAL HIGH (ref 65–99)
Osmolality: 280 (ref 275–301)
Potassium: 3.6 mmol/L (ref 3.5–5.1)

## 2012-12-29 LAB — CBC WITH DIFFERENTIAL/PLATELET
Basophil %: 1.2 %
Eosinophil %: 0.9 %
HCT: 32.9 % — ABNORMAL LOW (ref 40.0–52.0)
HGB: 10.6 g/dL — ABNORMAL LOW (ref 13.0–18.0)
Lymphocyte #: 1.7 10*3/uL (ref 1.0–3.6)
Lymphocyte %: 12.8 %
MCHC: 32.1 g/dL (ref 32.0–36.0)
MCV: 81 fL (ref 80–100)
Monocyte #: 1.6 x10 3/mm — ABNORMAL HIGH (ref 0.2–1.0)
Neutrophil %: 73.2 %
Platelet: 298 10*3/uL (ref 150–440)
RBC: 4.06 10*6/uL — ABNORMAL LOW (ref 4.40–5.90)
WBC: 13.6 10*3/uL — ABNORMAL HIGH (ref 3.8–10.6)

## 2012-12-30 LAB — CBC WITH DIFFERENTIAL/PLATELET
Basophil #: 0.2 10*3/uL — ABNORMAL HIGH (ref 0.0–0.1)
Eosinophil #: 0.1 10*3/uL (ref 0.0–0.7)
Eosinophil %: 0.9 %
HCT: 34.9 % — ABNORMAL LOW (ref 40.0–52.0)
HGB: 11.2 g/dL — ABNORMAL LOW (ref 13.0–18.0)
Lymphocyte #: 1.3 10*3/uL (ref 1.0–3.6)
Monocyte #: 1.5 x10 3/mm — ABNORMAL HIGH (ref 0.2–1.0)
Monocyte %: 9.9 %
Neutrophil #: 11.9 10*3/uL — ABNORMAL HIGH (ref 1.4–6.5)
Neutrophil %: 79.4 %
Platelet: 422 10*3/uL (ref 150–440)
WBC: 15 10*3/uL — ABNORMAL HIGH (ref 3.8–10.6)

## 2012-12-30 LAB — RENAL FUNCTION PANEL
Albumin: 2.2 g/dL — ABNORMAL LOW (ref 3.4–5.0)
Anion Gap: 13 (ref 7–16)
Calcium, Total: 8.9 mg/dL (ref 8.5–10.1)
Chloride: 98 mmol/L (ref 98–107)
Co2: 27 mmol/L (ref 21–32)
Creatinine: 5.26 mg/dL — ABNORMAL HIGH (ref 0.60–1.30)
EGFR (African American): 13 — ABNORMAL LOW
Glucose: 87 mg/dL (ref 65–99)
Osmolality: 288 (ref 275–301)
Potassium: 3.8 mmol/L (ref 3.5–5.1)
Sodium: 138 mmol/L (ref 136–145)

## 2013-01-01 ENCOUNTER — Ambulatory Visit: Payer: Self-pay | Admitting: Internal Medicine

## 2013-01-01 LAB — CBC WITH DIFFERENTIAL/PLATELET
Basophil #: 0.2 10*3/uL — ABNORMAL HIGH (ref 0.0–0.1)
Eosinophil #: 0.2 10*3/uL (ref 0.0–0.7)
Eosinophil %: 1.4 %
HGB: 11.2 g/dL — ABNORMAL LOW (ref 13.0–18.0)
Lymphocyte #: 1 10*3/uL (ref 1.0–3.6)
MCH: 26.4 pg (ref 26.0–34.0)
MCHC: 31.9 g/dL — ABNORMAL LOW (ref 32.0–36.0)
MCV: 83 fL (ref 80–100)
Monocyte #: 1.4 x10 3/mm — ABNORMAL HIGH (ref 0.2–1.0)
Monocyte %: 12.1 %
Neutrophil #: 9 10*3/uL — ABNORMAL HIGH (ref 1.4–6.5)
RBC: 4.25 10*6/uL — ABNORMAL LOW (ref 4.40–5.90)
WBC: 11.7 10*3/uL — ABNORMAL HIGH (ref 3.8–10.6)

## 2013-01-01 LAB — BASIC METABOLIC PANEL
Anion Gap: 17 — ABNORMAL HIGH (ref 7–16)
Calcium, Total: 8.6 mg/dL (ref 8.5–10.1)
Chloride: 97 mmol/L — ABNORMAL LOW (ref 98–107)
Co2: 23 mmol/L (ref 21–32)
EGFR (African American): 12 — ABNORMAL LOW
EGFR (Non-African Amer.): 11 — ABNORMAL LOW
Osmolality: 290 (ref 275–301)
Sodium: 137 mmol/L (ref 136–145)

## 2013-01-02 LAB — RENAL FUNCTION PANEL
Albumin: 2 g/dL — ABNORMAL LOW (ref 3.4–5.0)
BUN: 61 mg/dL — ABNORMAL HIGH (ref 7–18)
Calcium, Total: 8.6 mg/dL (ref 8.5–10.1)
Co2: 24 mmol/L (ref 21–32)
Creatinine: 6.69 mg/dL — ABNORMAL HIGH (ref 0.60–1.30)
EGFR (African American): 10 — ABNORMAL LOW
EGFR (Non-African Amer.): 9 — ABNORMAL LOW
Phosphorus: 9.4 mg/dL — ABNORMAL HIGH (ref 2.5–4.9)
Sodium: 138 mmol/L (ref 136–145)

## 2013-01-03 LAB — CBC WITH DIFFERENTIAL/PLATELET
Basophil #: 0.2 10*3/uL — ABNORMAL HIGH (ref 0.0–0.1)
Eosinophil #: 0.1 10*3/uL (ref 0.0–0.7)
Eosinophil %: 1.4 %
HCT: 36.6 % — ABNORMAL LOW (ref 40.0–52.0)
HGB: 11.8 g/dL — ABNORMAL LOW (ref 13.0–18.0)
Lymphocyte #: 1.4 10*3/uL (ref 1.0–3.6)
MCH: 27 pg (ref 26.0–34.0)
MCHC: 32.4 g/dL (ref 32.0–36.0)
MCV: 83 fL (ref 80–100)
Monocyte #: 1.1 x10 3/mm — ABNORMAL HIGH (ref 0.2–1.0)
Neutrophil #: 6.6 10*3/uL — ABNORMAL HIGH (ref 1.4–6.5)
Platelet: 487 10*3/uL — ABNORMAL HIGH (ref 150–440)
RBC: 4.39 10*6/uL — ABNORMAL LOW (ref 4.40–5.90)
WBC: 9.4 10*3/uL (ref 3.8–10.6)

## 2013-01-03 LAB — BASIC METABOLIC PANEL
Anion Gap: 16 (ref 7–16)
Co2: 24 mmol/L (ref 21–32)
EGFR (African American): 17 — ABNORMAL LOW
EGFR (Non-African Amer.): 15 — ABNORMAL LOW
Osmolality: 281 (ref 275–301)
Sodium: 136 mmol/L (ref 136–145)

## 2013-01-04 LAB — CBC WITH DIFFERENTIAL/PLATELET
Basophil #: 0.1 10*3/uL (ref 0.0–0.1)
Basophil %: 1.4 %
Eosinophil #: 0.3 10*3/uL (ref 0.0–0.7)
Eosinophil %: 2.7 %
HCT: 35.7 % — ABNORMAL LOW (ref 40.0–52.0)
HGB: 11.5 g/dL — ABNORMAL LOW (ref 13.0–18.0)
Lymphocyte #: 1 10*3/uL (ref 1.0–3.6)
MCH: 26.9 pg (ref 26.0–34.0)
MCHC: 32.2 g/dL (ref 32.0–36.0)
Monocyte #: 1.1 x10 3/mm — ABNORMAL HIGH (ref 0.2–1.0)
Monocyte %: 11.1 %
Neutrophil %: 74.9 %

## 2013-01-04 LAB — RENAL FUNCTION PANEL
BUN: 48 mg/dL — ABNORMAL HIGH (ref 7–18)
Chloride: 97 mmol/L — ABNORMAL LOW (ref 98–107)
Co2: 27 mmol/L (ref 21–32)
Creatinine: 5.8 mg/dL — ABNORMAL HIGH (ref 0.60–1.30)
EGFR (African American): 12 — ABNORMAL LOW
EGFR (Non-African Amer.): 10 — ABNORMAL LOW
Glucose: 165 mg/dL — ABNORMAL HIGH (ref 65–99)
Osmolality: 288 (ref 275–301)
Sodium: 136 mmol/L (ref 136–145)

## 2013-01-05 LAB — BASIC METABOLIC PANEL
BUN: 23 mg/dL — ABNORMAL HIGH (ref 7–18)
Chloride: 97 mmol/L — ABNORMAL LOW (ref 98–107)
Creatinine: 3.57 mg/dL — ABNORMAL HIGH (ref 0.60–1.30)
EGFR (Non-African Amer.): 18 — ABNORMAL LOW
Glucose: 137 mg/dL — ABNORMAL HIGH (ref 65–99)
Osmolality: 274 (ref 275–301)

## 2013-01-06 LAB — COMPREHENSIVE METABOLIC PANEL
Alkaline Phosphatase: 187 U/L — ABNORMAL HIGH (ref 50–136)
Anion Gap: 10 (ref 7–16)
Calcium, Total: 8.3 mg/dL — ABNORMAL LOW (ref 8.5–10.1)
Co2: 27 mmol/L (ref 21–32)
Creatinine: 4.89 mg/dL — ABNORMAL HIGH (ref 0.60–1.30)
EGFR (Non-African Amer.): 12 — ABNORMAL LOW
Osmolality: 277 (ref 275–301)
SGPT (ALT): 12 U/L (ref 12–78)
Sodium: 134 mmol/L — ABNORMAL LOW (ref 136–145)
Total Protein: 7 g/dL (ref 6.4–8.2)

## 2013-01-06 LAB — CBC WITH DIFFERENTIAL/PLATELET
Basophil #: 0.1 10*3/uL (ref 0.0–0.1)
Basophil %: 1 %
Eosinophil %: 4.7 %
HCT: 35.1 % — ABNORMAL LOW (ref 40.0–52.0)
HGB: 11.1 g/dL — ABNORMAL LOW (ref 13.0–18.0)
Lymphocyte %: 10.6 %
MCH: 26.8 pg (ref 26.0–34.0)
MCHC: 31.7 g/dL — ABNORMAL LOW (ref 32.0–36.0)
MCV: 85 fL (ref 80–100)
Monocyte #: 1.3 x10 3/mm — ABNORMAL HIGH (ref 0.2–1.0)
Monocyte %: 10.7 %
Neutrophil #: 8.8 10*3/uL — ABNORMAL HIGH (ref 1.4–6.5)
Neutrophil %: 73 %
Platelet: 352 10*3/uL (ref 150–440)
RBC: 4.15 10*6/uL — ABNORMAL LOW (ref 4.40–5.90)
RDW: 21.3 % — ABNORMAL HIGH (ref 11.5–14.5)
WBC: 12 10*3/uL — ABNORMAL HIGH (ref 3.8–10.6)

## 2013-01-07 LAB — CBC WITH DIFFERENTIAL/PLATELET
Basophil %: 0.9 %
HCT: 33.9 % — ABNORMAL LOW (ref 40.0–52.0)
HGB: 10.8 g/dL — ABNORMAL LOW (ref 13.0–18.0)
Lymphocyte %: 9.5 %
MCV: 85 fL (ref 80–100)
Monocyte #: 1.4 x10 3/mm — ABNORMAL HIGH (ref 0.2–1.0)
Monocyte %: 9.6 %
Neutrophil %: 76.8 %
Platelet: 291 10*3/uL (ref 150–440)
RBC: 4.02 10*6/uL — ABNORMAL LOW (ref 4.40–5.90)

## 2013-01-07 LAB — COMPREHENSIVE METABOLIC PANEL
Albumin: 1.8 g/dL — ABNORMAL LOW (ref 3.4–5.0)
Anion Gap: 8 (ref 7–16)
BUN: 21 mg/dL — ABNORMAL HIGH (ref 7–18)
Bilirubin,Total: 0.7 mg/dL (ref 0.2–1.0)
Calcium, Total: 8.1 mg/dL — ABNORMAL LOW (ref 8.5–10.1)
Co2: 29 mmol/L (ref 21–32)
Creatinine: 4.01 mg/dL — ABNORMAL HIGH (ref 0.60–1.30)
EGFR (African American): 18 — ABNORMAL LOW
Osmolality: 274 (ref 275–301)
Potassium: 3.5 mmol/L (ref 3.5–5.1)
SGOT(AST): 15 U/L (ref 15–37)
Sodium: 134 mmol/L — ABNORMAL LOW (ref 136–145)

## 2013-01-08 LAB — COMPREHENSIVE METABOLIC PANEL
Alkaline Phosphatase: 183 U/L — ABNORMAL HIGH (ref 50–136)
BUN: 24 mg/dL — ABNORMAL HIGH (ref 7–18)
Bilirubin,Total: 0.6 mg/dL (ref 0.2–1.0)
Calcium, Total: 8 mg/dL — ABNORMAL LOW (ref 8.5–10.1)
Creatinine: 4.69 mg/dL — ABNORMAL HIGH (ref 0.60–1.30)
EGFR (African American): 15 — ABNORMAL LOW
EGFR (Non-African Amer.): 13 — ABNORMAL LOW
Glucose: 133 mg/dL — ABNORMAL HIGH (ref 65–99)
Potassium: 3.4 mmol/L — ABNORMAL LOW (ref 3.5–5.1)
SGOT(AST): 12 U/L — ABNORMAL LOW (ref 15–37)
SGPT (ALT): 10 U/L — ABNORMAL LOW (ref 12–78)

## 2013-01-08 LAB — CBC WITH DIFFERENTIAL/PLATELET
Basophil #: 0.1 10*3/uL (ref 0.0–0.1)
Basophil %: 0.9 %
Eosinophil #: 0.6 10*3/uL (ref 0.0–0.7)
Eosinophil %: 6 %
Lymphocyte #: 1.2 10*3/uL (ref 1.0–3.6)
Lymphocyte %: 11.5 %
MCH: 27.5 pg (ref 26.0–34.0)
MCHC: 32.5 g/dL (ref 32.0–36.0)
Monocyte #: 1.1 x10 3/mm — ABNORMAL HIGH (ref 0.2–1.0)
Monocyte %: 10.2 %
Neutrophil #: 7.4 10*3/uL — ABNORMAL HIGH (ref 1.4–6.5)
RBC: 4.1 10*6/uL — ABNORMAL LOW (ref 4.40–5.90)
RDW: 20.8 % — ABNORMAL HIGH (ref 11.5–14.5)
WBC: 10.3 10*3/uL (ref 3.8–10.6)

## 2013-01-08 LAB — PHOSPHORUS: Phosphorus: 4.2 mg/dL (ref 2.5–4.9)

## 2013-01-09 LAB — PHOSPHORUS: Phosphorus: 3.8 mg/dL (ref 2.5–4.9)

## 2013-01-11 LAB — CULTURE, BLOOD (SINGLE)

## 2013-01-13 ENCOUNTER — Inpatient Hospital Stay: Payer: Self-pay | Admitting: Family Medicine

## 2013-01-13 LAB — COMPREHENSIVE METABOLIC PANEL
Albumin: 2 g/dL — ABNORMAL LOW (ref 3.4–5.0)
Alkaline Phosphatase: 254 U/L — ABNORMAL HIGH (ref 50–136)
Calcium, Total: 8.3 mg/dL — ABNORMAL LOW (ref 8.5–10.1)
Chloride: 99 mmol/L (ref 98–107)
Co2: 30 mmol/L (ref 21–32)
EGFR (African American): 29 — ABNORMAL LOW
Glucose: 50 mg/dL — ABNORMAL LOW (ref 65–99)
Potassium: 3.6 mmol/L (ref 3.5–5.1)
SGOT(AST): 26 U/L (ref 15–37)
SGPT (ALT): 12 U/L (ref 12–78)

## 2013-01-13 LAB — CBC
HCT: 37.9 % — ABNORMAL LOW (ref 40.0–52.0)
HGB: 11.7 g/dL — ABNORMAL LOW (ref 13.0–18.0)
MCH: 26.3 pg (ref 26.0–34.0)
MCHC: 31 g/dL — ABNORMAL LOW (ref 32.0–36.0)
MCV: 85 fL (ref 80–100)
Platelet: 402 10*3/uL (ref 150–440)
RBC: 4.46 10*6/uL (ref 4.40–5.90)
WBC: 52.2 10*3/uL — ABNORMAL HIGH (ref 3.8–10.6)

## 2013-01-13 LAB — PROTIME-INR: Prothrombin Time: 15.7 secs — ABNORMAL HIGH (ref 11.5–14.7)

## 2013-01-14 LAB — CBC WITH DIFFERENTIAL/PLATELET
Basophil #: 0.1 10*3/uL (ref 0.0–0.1)
HCT: 33 % — ABNORMAL LOW (ref 40.0–52.0)
HGB: 10.3 g/dL — ABNORMAL LOW (ref 13.0–18.0)
Lymphocyte #: 1.9 10*3/uL (ref 1.0–3.6)
Lymphocyte %: 9.6 %
MCH: 26.5 pg (ref 26.0–34.0)
MCHC: 31.1 g/dL — ABNORMAL LOW (ref 32.0–36.0)
Monocyte #: 1.4 x10 3/mm — ABNORMAL HIGH (ref 0.2–1.0)
Monocyte %: 7.1 %
Neutrophil #: 15.6 10*3/uL — ABNORMAL HIGH (ref 1.4–6.5)
Neutrophil %: 80.5 %
RBC: 3.88 10*6/uL — ABNORMAL LOW (ref 4.40–5.90)
RDW: 20.1 % — ABNORMAL HIGH (ref 11.5–14.5)

## 2013-01-14 LAB — COMPREHENSIVE METABOLIC PANEL
Albumin: 1.7 g/dL — ABNORMAL LOW (ref 3.4–5.0)
Alkaline Phosphatase: 186 U/L — ABNORMAL HIGH (ref 50–136)
Anion Gap: 6 — ABNORMAL LOW (ref 7–16)
BUN: 17 mg/dL (ref 7–18)
Bilirubin,Total: 0.5 mg/dL (ref 0.2–1.0)
Chloride: 98 mmol/L (ref 98–107)
Co2: 31 mmol/L (ref 21–32)
Creatinine: 3.58 mg/dL — ABNORMAL HIGH (ref 0.60–1.30)
EGFR (African American): 21 — ABNORMAL LOW
EGFR (Non-African Amer.): 18 — ABNORMAL LOW
Glucose: 34 mg/dL — CL (ref 65–99)
Osmolality: 268 (ref 275–301)
Potassium: 3.2 mmol/L — ABNORMAL LOW (ref 3.5–5.1)
Sodium: 135 mmol/L — ABNORMAL LOW (ref 136–145)
Total Protein: 5.9 g/dL — ABNORMAL LOW (ref 6.4–8.2)

## 2013-01-14 LAB — PHOSPHORUS: Phosphorus: 1.9 mg/dL — ABNORMAL LOW (ref 2.5–4.9)

## 2013-01-15 LAB — PHOSPHORUS: Phosphorus: 2.4 mg/dL — ABNORMAL LOW (ref 2.5–4.9)

## 2013-01-15 LAB — BASIC METABOLIC PANEL
BUN: 10 mg/dL (ref 7–18)
Calcium, Total: 7.9 mg/dL — ABNORMAL LOW (ref 8.5–10.1)
Chloride: 94 mmol/L — ABNORMAL LOW (ref 98–107)
Co2: 31 mmol/L (ref 21–32)
Creatinine: 2.5 mg/dL — ABNORMAL HIGH (ref 0.60–1.30)
Glucose: 281 mg/dL — ABNORMAL HIGH (ref 65–99)
Sodium: 132 mmol/L — ABNORMAL LOW (ref 136–145)

## 2013-01-15 LAB — CBC WITH DIFFERENTIAL/PLATELET
Basophil #: 0 10*3/uL (ref 0.0–0.1)
Basophil %: 0.1 %
HCT: 31.9 % — ABNORMAL LOW (ref 40.0–52.0)
Lymphocyte #: 0.8 10*3/uL — ABNORMAL LOW (ref 1.0–3.6)
Lymphocyte %: 3.2 %
MCHC: 31.3 g/dL — ABNORMAL LOW (ref 32.0–36.0)
Monocyte #: 0.5 x10 3/mm (ref 0.2–1.0)
Monocyte %: 2 %
Neutrophil #: 24 10*3/uL — ABNORMAL HIGH (ref 1.4–6.5)
Platelet: 300 10*3/uL (ref 150–440)
RBC: 3.76 10*6/uL — ABNORMAL LOW (ref 4.40–5.90)

## 2013-01-16 LAB — CBC WITH DIFFERENTIAL/PLATELET
Basophil %: 0.1 %
Eosinophil #: 0 10*3/uL (ref 0.0–0.7)
Eosinophil %: 0 %
HCT: 33.6 % — ABNORMAL LOW (ref 40.0–52.0)
Lymphocyte #: 1.3 10*3/uL (ref 1.0–3.6)
Lymphocyte %: 6.8 %
MCH: 26.6 pg (ref 26.0–34.0)
MCV: 85 fL (ref 80–100)
Monocyte #: 1 x10 3/mm (ref 0.2–1.0)
Monocyte %: 5 %
RBC: 3.97 10*6/uL — ABNORMAL LOW (ref 4.40–5.90)

## 2013-01-16 LAB — BASIC METABOLIC PANEL
Anion Gap: 8 (ref 7–16)
BUN: 19 mg/dL — ABNORMAL HIGH (ref 7–18)
Chloride: 99 mmol/L (ref 98–107)
Creatinine: 3.19 mg/dL — ABNORMAL HIGH (ref 0.60–1.30)
EGFR (African American): 24 — ABNORMAL LOW
Glucose: 163 mg/dL — ABNORMAL HIGH (ref 65–99)
Sodium: 137 mmol/L (ref 136–145)

## 2013-01-17 LAB — CBC WITH DIFFERENTIAL/PLATELET
Basophil %: 0 %
Eosinophil %: 0.3 %
HCT: 35.3 % — ABNORMAL LOW (ref 40.0–52.0)
HGB: 11.2 g/dL — ABNORMAL LOW (ref 13.0–18.0)
Lymphocyte #: 0.9 10*3/uL — ABNORMAL LOW (ref 1.0–3.6)
Lymphocyte %: 5.6 %
MCH: 27 pg (ref 26.0–34.0)
MCHC: 31.8 g/dL — ABNORMAL LOW (ref 32.0–36.0)
Monocyte #: 0.7 x10 3/mm (ref 0.2–1.0)
Monocyte %: 4.1 %
Neutrophil %: 90 %
RBC: 4.17 10*6/uL — ABNORMAL LOW (ref 4.40–5.90)
RDW: 20 % — ABNORMAL HIGH (ref 11.5–14.5)

## 2013-01-17 LAB — RENAL FUNCTION PANEL
Albumin: 2.2 g/dL — ABNORMAL LOW (ref 3.4–5.0)
Calcium, Total: 8.5 mg/dL (ref 8.5–10.1)
Chloride: 99 mmol/L (ref 98–107)
Co2: 31 mmol/L (ref 21–32)
EGFR (African American): 36 — ABNORMAL LOW
EGFR (Non-African Amer.): 31 — ABNORMAL LOW
Glucose: 187 mg/dL — ABNORMAL HIGH (ref 65–99)
Sodium: 137 mmol/L (ref 136–145)

## 2013-01-18 LAB — CBC WITH DIFFERENTIAL/PLATELET
Basophil #: 0.1 10*3/uL (ref 0.0–0.1)
Eosinophil #: 0.2 10*3/uL (ref 0.0–0.7)
Eosinophil %: 0.9 %
HCT: 34.1 % — ABNORMAL LOW (ref 40.0–52.0)
MCH: 26.7 pg (ref 26.0–34.0)
MCHC: 31.5 g/dL — ABNORMAL LOW (ref 32.0–36.0)
MCV: 85 fL (ref 80–100)
Monocyte #: 1.8 x10 3/mm — ABNORMAL HIGH (ref 0.2–1.0)
Monocyte %: 8.6 %
Neutrophil #: 17.4 10*3/uL — ABNORMAL HIGH (ref 1.4–6.5)
Neutrophil %: 82.7 %
Platelet: 348 10*3/uL (ref 150–440)

## 2013-01-18 LAB — RENAL FUNCTION PANEL
BUN: 28 mg/dL — ABNORMAL HIGH (ref 7–18)
Calcium, Total: 8.3 mg/dL — ABNORMAL LOW (ref 8.5–10.1)
Chloride: 104 mmol/L (ref 98–107)
Co2: 26 mmol/L (ref 21–32)
EGFR (Non-African Amer.): 17 — ABNORMAL LOW
Phosphorus: 1.3 mg/dL — ABNORMAL LOW (ref 2.5–4.9)
Potassium: 3.9 mmol/L (ref 3.5–5.1)
Sodium: 140 mmol/L (ref 136–145)

## 2013-01-19 LAB — RENAL FUNCTION PANEL
Albumin: 2 g/dL — ABNORMAL LOW (ref 3.4–5.0)
BUN: 23 mg/dL — ABNORMAL HIGH (ref 7–18)
Calcium, Total: 8.4 mg/dL — ABNORMAL LOW (ref 8.5–10.1)
Chloride: 102 mmol/L (ref 98–107)
EGFR (African American): 21 — ABNORMAL LOW
Glucose: 165 mg/dL — ABNORMAL HIGH (ref 65–99)
Phosphorus: 1.8 mg/dL — ABNORMAL LOW (ref 2.5–4.9)
Sodium: 139 mmol/L (ref 136–145)

## 2013-01-19 LAB — CBC WITH DIFFERENTIAL/PLATELET
Basophil #: 0.1 10*3/uL (ref 0.0–0.1)
Basophil %: 0.4 %
Eosinophil %: 3.1 %
HCT: 31 % — ABNORMAL LOW (ref 40.0–52.0)
Lymphocyte %: 8.7 %
MCHC: 31.8 g/dL — ABNORMAL LOW (ref 32.0–36.0)
Monocyte #: 1.5 x10 3/mm — ABNORMAL HIGH (ref 0.2–1.0)
Monocyte %: 8.1 %
Neutrophil %: 79.7 %
RBC: 3.64 10*6/uL — ABNORMAL LOW (ref 4.40–5.90)
WBC: 18.8 10*3/uL — ABNORMAL HIGH (ref 3.8–10.6)

## 2013-01-23 ENCOUNTER — Inpatient Hospital Stay: Payer: Self-pay | Admitting: Family Medicine

## 2013-01-23 LAB — TROPONIN I
Troponin-I: <0.02 ng/mL
Troponin-I: <0.02 ng/mL

## 2013-01-23 LAB — CBC
HCT: 32.4 % — ABNORMAL LOW (ref 40.0–52.0)
HGB: 10 g/dL — ABNORMAL LOW (ref 13.0–18.0)
MCV: 86 fL (ref 80–100)
Platelet: 467 10*3/uL — ABNORMAL HIGH (ref 150–440)
RDW: 20.1 % — ABNORMAL HIGH (ref 11.5–14.5)

## 2013-01-23 LAB — DIFFERENTIAL
Eosinophil #: 0.4 10*3/uL (ref 0.0–0.7)
Lymphocyte %: 3 %
Monocyte #: 1.3 x10 3/mm — ABNORMAL HIGH (ref 0.2–1.0)
Monocyte %: 4 %
Neutrophil #: 30.1 10*3/uL — ABNORMAL HIGH (ref 1.4–6.5)
Neutrophil %: 91.6 %

## 2013-01-23 LAB — COMPREHENSIVE METABOLIC PANEL
Albumin: 2.2 g/dL — ABNORMAL LOW (ref 3.4–5.0)
Alkaline Phosphatase: 316 U/L — ABNORMAL HIGH (ref 50–136)
Anion Gap: 8 (ref 7–16)
Bilirubin,Total: 0.7 mg/dL (ref 0.2–1.0)
Co2: 28 mmol/L (ref 21–32)
Creatinine: 6.19 mg/dL — ABNORMAL HIGH (ref 0.60–1.30)
EGFR (African American): 11 — ABNORMAL LOW
Glucose: 150 mg/dL — ABNORMAL HIGH (ref 65–99)
Osmolality: 287 (ref 275–301)
SGPT (ALT): 11 U/L — ABNORMAL LOW (ref 12–78)
Total Protein: 7.4 g/dL (ref 6.4–8.2)

## 2013-01-23 LAB — CK TOTAL AND CKMB (NOT AT ARMC)
CK, Total: 29 U/L — ABNORMAL LOW (ref 35–232)
CK, Total: 13 U/L — ABNORMAL LOW (ref 35–232)
CK-MB: <0.5 ng/mL — ABNORMAL LOW (ref 0.5–3.6)

## 2013-01-24 LAB — BASIC METABOLIC PANEL
Anion Gap: 8 (ref 7–16)
BUN: 42 mg/dL — ABNORMAL HIGH (ref 7–18)
Calcium, Total: 8.2 mg/dL — ABNORMAL LOW (ref 8.5–10.1)
Chloride: 100 mmol/L (ref 98–107)
EGFR (African American): 10 — ABNORMAL LOW
Glucose: 117 mg/dL — ABNORMAL HIGH (ref 65–99)
Osmolality: 285 (ref 275–301)
Sodium: 137 mmol/L (ref 136–145)

## 2013-01-24 LAB — CBC WITH DIFFERENTIAL/PLATELET
Basophil %: 0.3 %
Eosinophil #: 0.6 10*3/uL (ref 0.0–0.7)
HCT: 29.2 % — ABNORMAL LOW (ref 40.0–52.0)
HGB: 9.4 g/dL — ABNORMAL LOW (ref 13.0–18.0)
MCHC: 32.1 g/dL (ref 32.0–36.0)
Neutrophil #: 15.1 10*3/uL — ABNORMAL HIGH (ref 1.4–6.5)
Neutrophil %: 85.2 %
Platelet: 412 10*3/uL (ref 150–440)
RBC: 3.43 10*6/uL — ABNORMAL LOW (ref 4.40–5.90)
RDW: 19.6 % — ABNORMAL HIGH (ref 11.5–14.5)
WBC: 17.8 10*3/uL — ABNORMAL HIGH (ref 3.8–10.6)

## 2013-01-24 LAB — CK TOTAL AND CKMB (NOT AT ARMC)
CK, Total: 11 U/L — ABNORMAL LOW (ref 35–232)
CK-MB: <0.5 ng/mL — ABNORMAL LOW (ref 0.5–3.6)

## 2013-01-24 LAB — CULTURE, BLOOD (SINGLE)

## 2013-01-28 LAB — CULTURE, BLOOD (SINGLE)

## 2013-01-31 ENCOUNTER — Ambulatory Visit: Payer: Self-pay | Admitting: Internal Medicine

## 2013-01-31 DEATH — deceased

## 2013-11-10 IMAGING — XA IR VASCULAR PROCEDURE
1 series · 1 of 1 positions shown · non-contrast
Comparison: none

[Series 1: single · 1 of 1 slices shown]
[im 1/1]
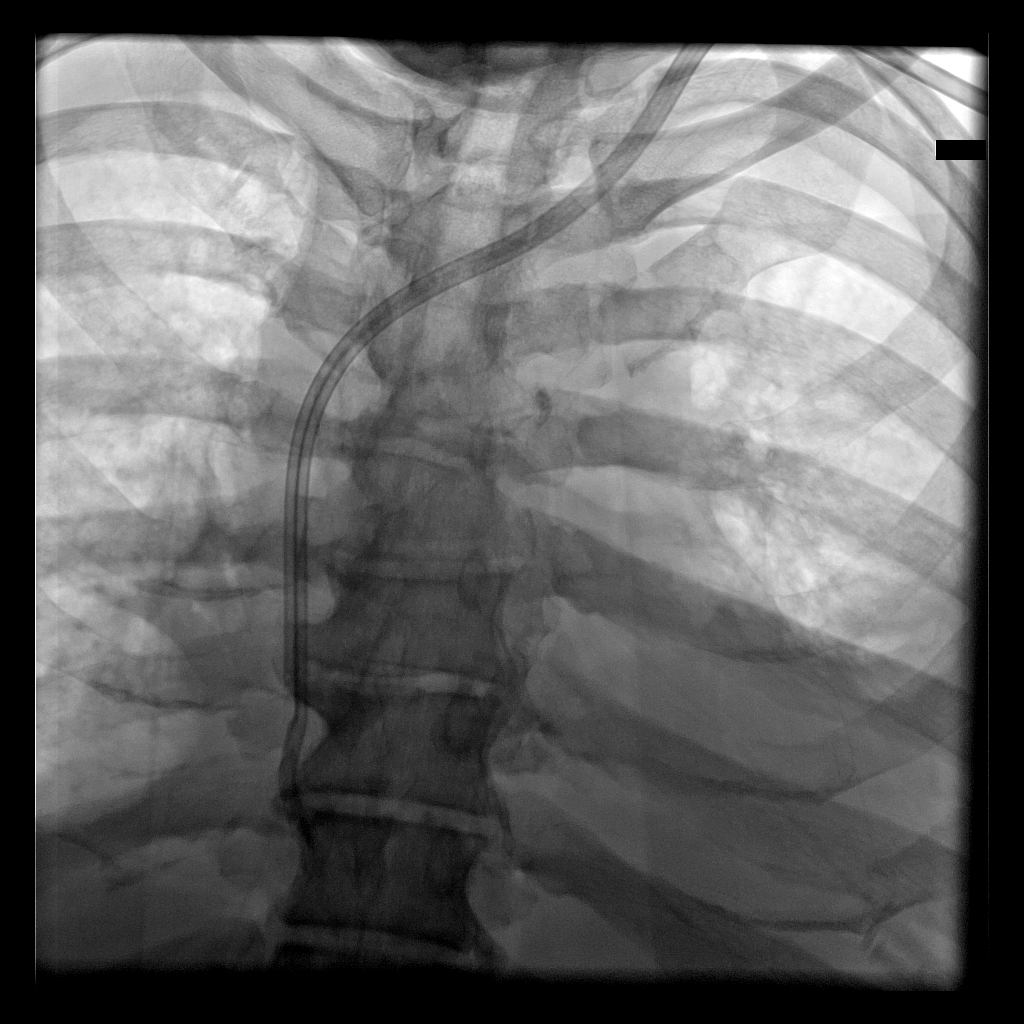

[1 of 1 positions shown; findings below may reference images not displayed]

IMAGES IMPORTED FROM THE [employer] WORKFLOW SYSTEM
NO DICTATION FOR STUDY

## 2014-03-04 IMAGING — CT CT CHEST W/ CM
1 series · 15 of 32 positions shown, 19 images · IV contrast (agent unspecified)
Comparison: none

REASON FOR EXAM: left pleural effusion
COMMENTS:

PROCEDURE:     CT  - CT CHEST WITH CONTRAST  - January 14, 2013 [DATE]
RESULT:
Comparison is made to a prior study dated 12/19/2012.
TECHNIQUE: Helical 3 mm sections were obtained from the thoracic inlet
through the lung bases status post intravenous administration of 75 mL of
Osovue-EOE.

[Series 2: soft tissue · axial · 0.78mm/px · z∈[-351,-3]mm · 15 of 131 slices shown, 19 images]
[im 10/131  mediastinal]
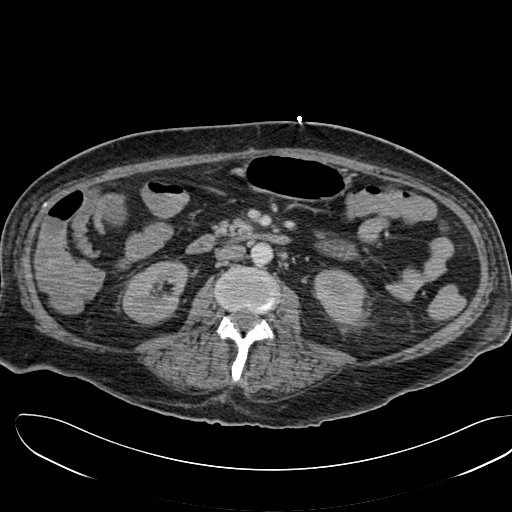
[im 10/131  lung]
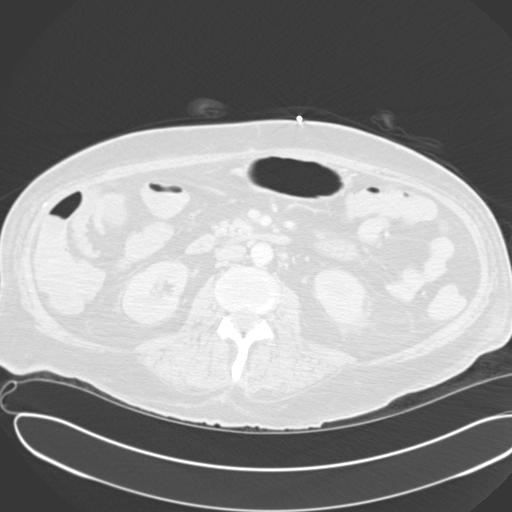
[im 20/131  lung]
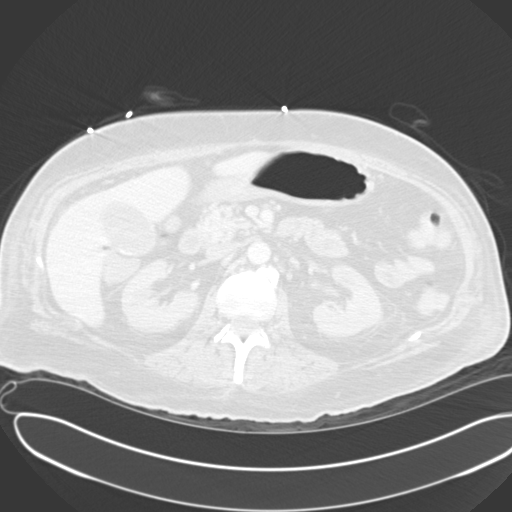
[im 27/131  lung]
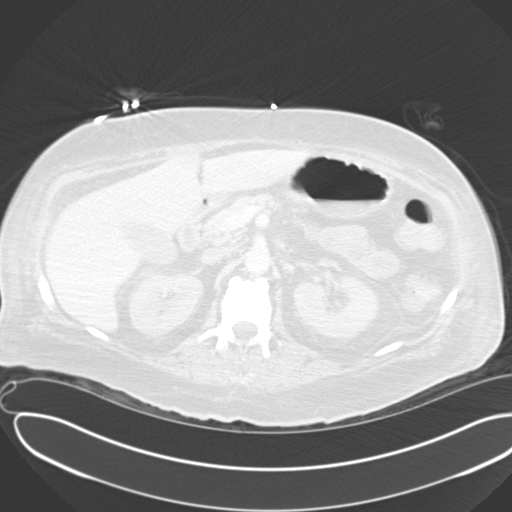
[im 34/131  lung]
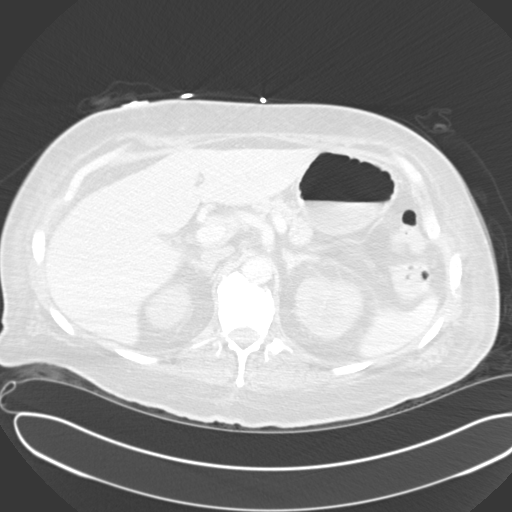
[im 44/131  mediastinal]
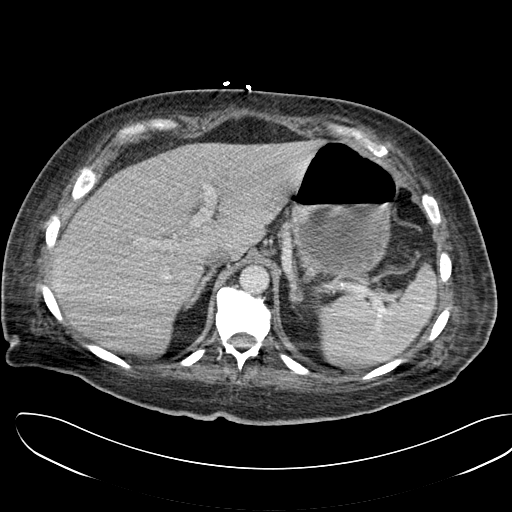
[im 44/131  lung]
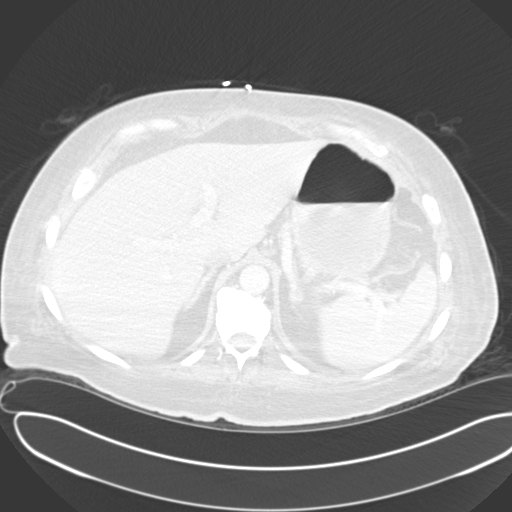
[im 53/131  lung]
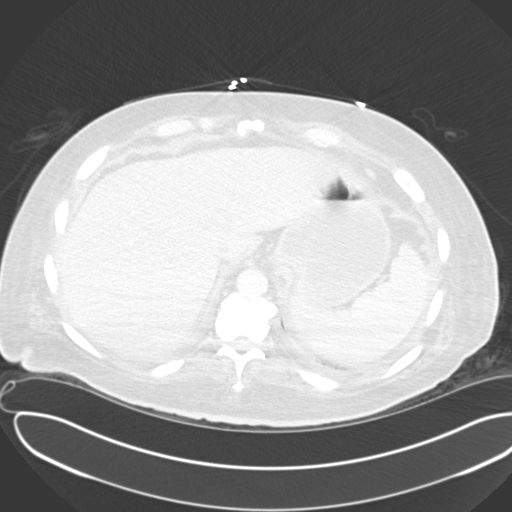
[im 63/131  lung]
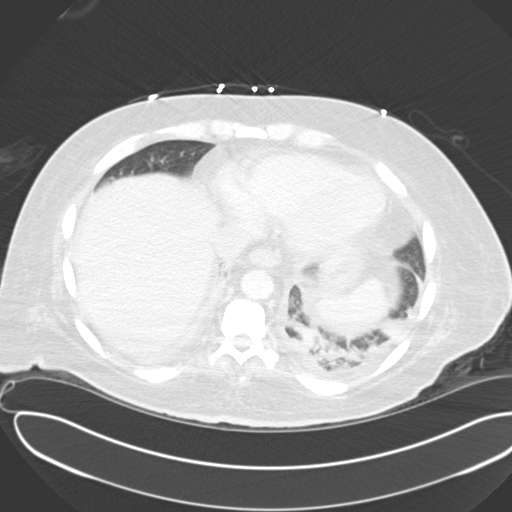
[im 69/131  lung]
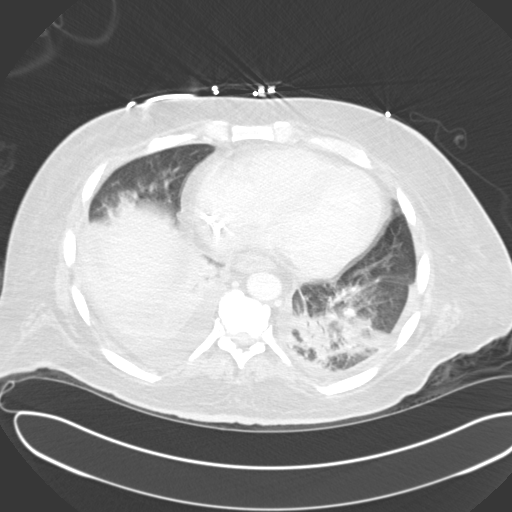
[im 78/131  mediastinal]
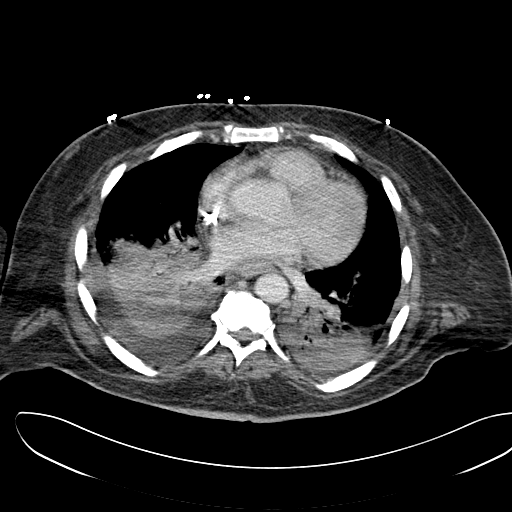
[im 78/131  lung]
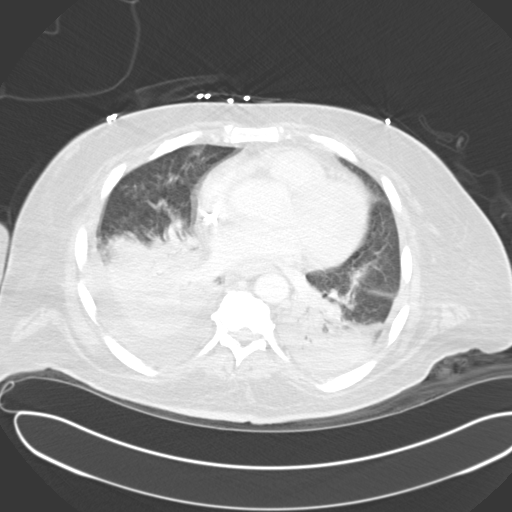
[im 82/131  lung]
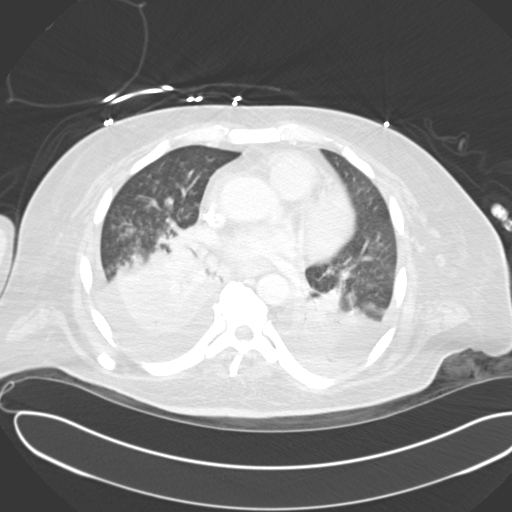
[im 92/131  lung]
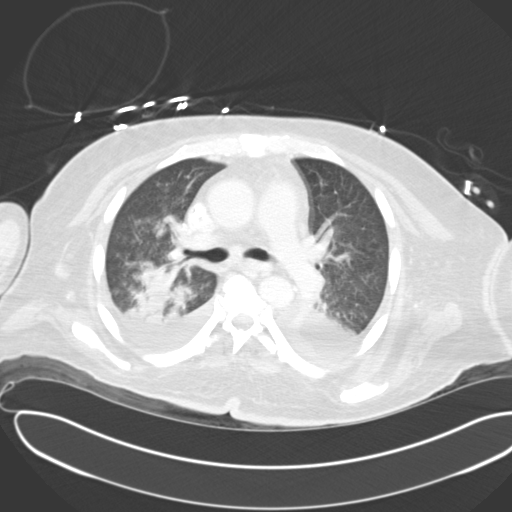
[im 102/131  lung]
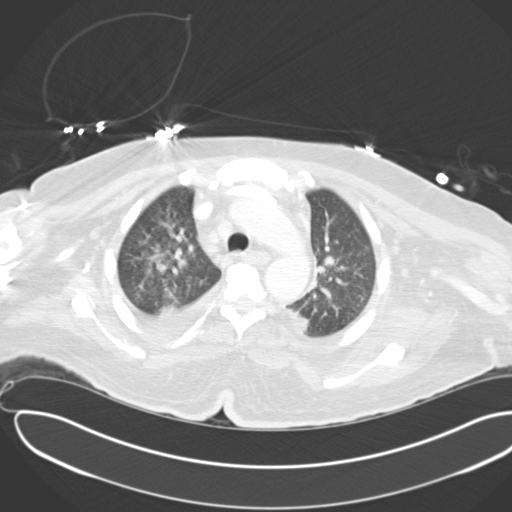
[im 106/131  mediastinal]
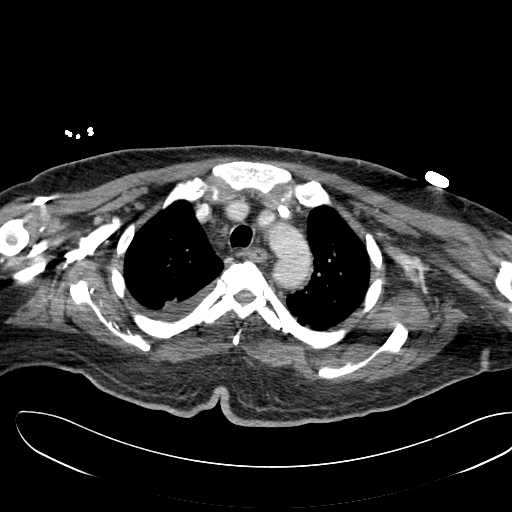
[im 106/131  lung]
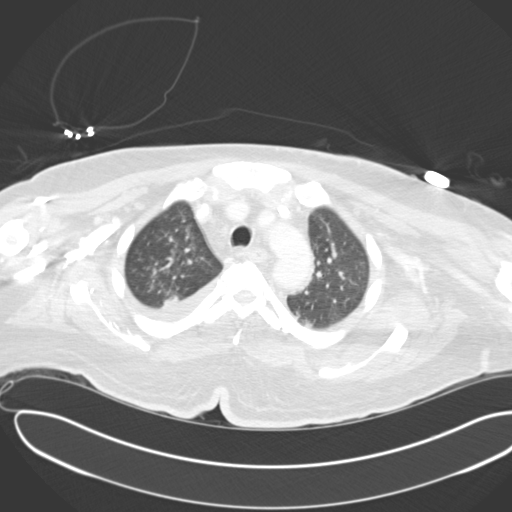
[im 116/131  lung]
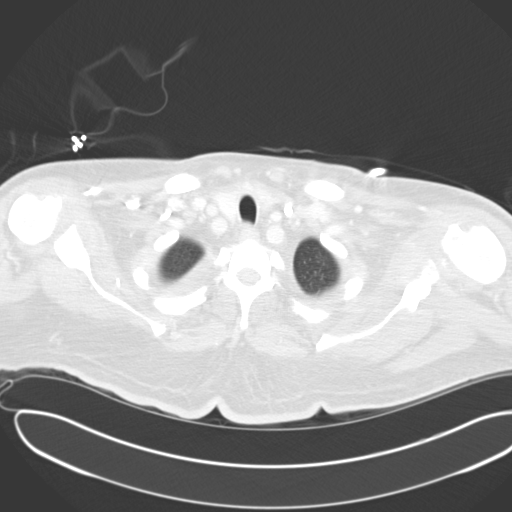
[im 126/131  lung]
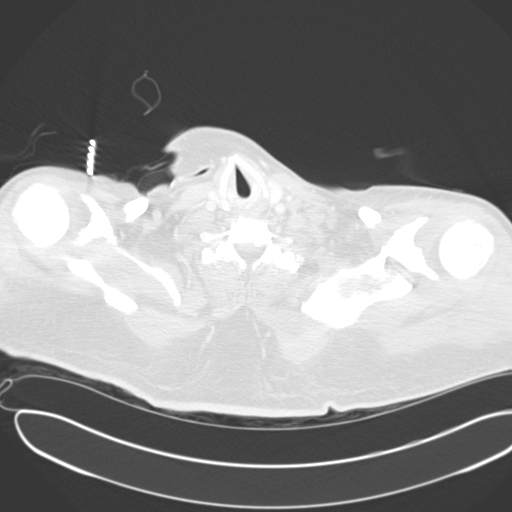

[15 of 32 positions shown; findings below may reference images not displayed]

FINDINGS: Evaluation of the mediastinum and hilar regions and structures
demonstrates multichamber cardiac enlargement. Prominent lymph nodes are
identified within the prevascular space and paratracheal region. The largest
is in the right paratracheal region with short axis dimensions of 1.46 cm.
When compared to the previous study there has been interval development of
consolidative density within the right lower lobe. Persistent density
projects within the left lower lobe though slight decrease in conspicuity.
Small bilateral effusions have developed in the interim. The aerated lung
parenchyma demonstrates areas of thickening of the interstitial markings.
The visualized upper abdominal viscera demonstrate very small dependent
gallstones within the gallbladder and otherwise unremarkable.
IMPRESSION: 1. Consolidative density within the right and left hemithoraces which have
the appearance of consolidative infiltrate when compared to the previous
study dated 12/19/2012.
[DATE]. Small bilateral pleural effusions, right greater than left.
3. Interstitial infiltrate may possibly represent an underlying component of
pulmonary edema. Surveillance evaluation is recommended.

## 2014-03-13 IMAGING — CR DG CHEST 1V PORT
1 series · 1 of 1 positions shown · non-contrast
Comparison: none

REASON FOR EXAM: Altered Mental Status
COMMENTS:

PROCEDURE:     DXR - DXR PORTABLE CHEST SINGLE VIEW  - January 23, 2013 [DATE]
RESULT:     Comparison made to prior study 01/18/2013. Large dual-lumen
catheter noted tip in right atrium. Lungs clear. Heart size normal.

[ap]
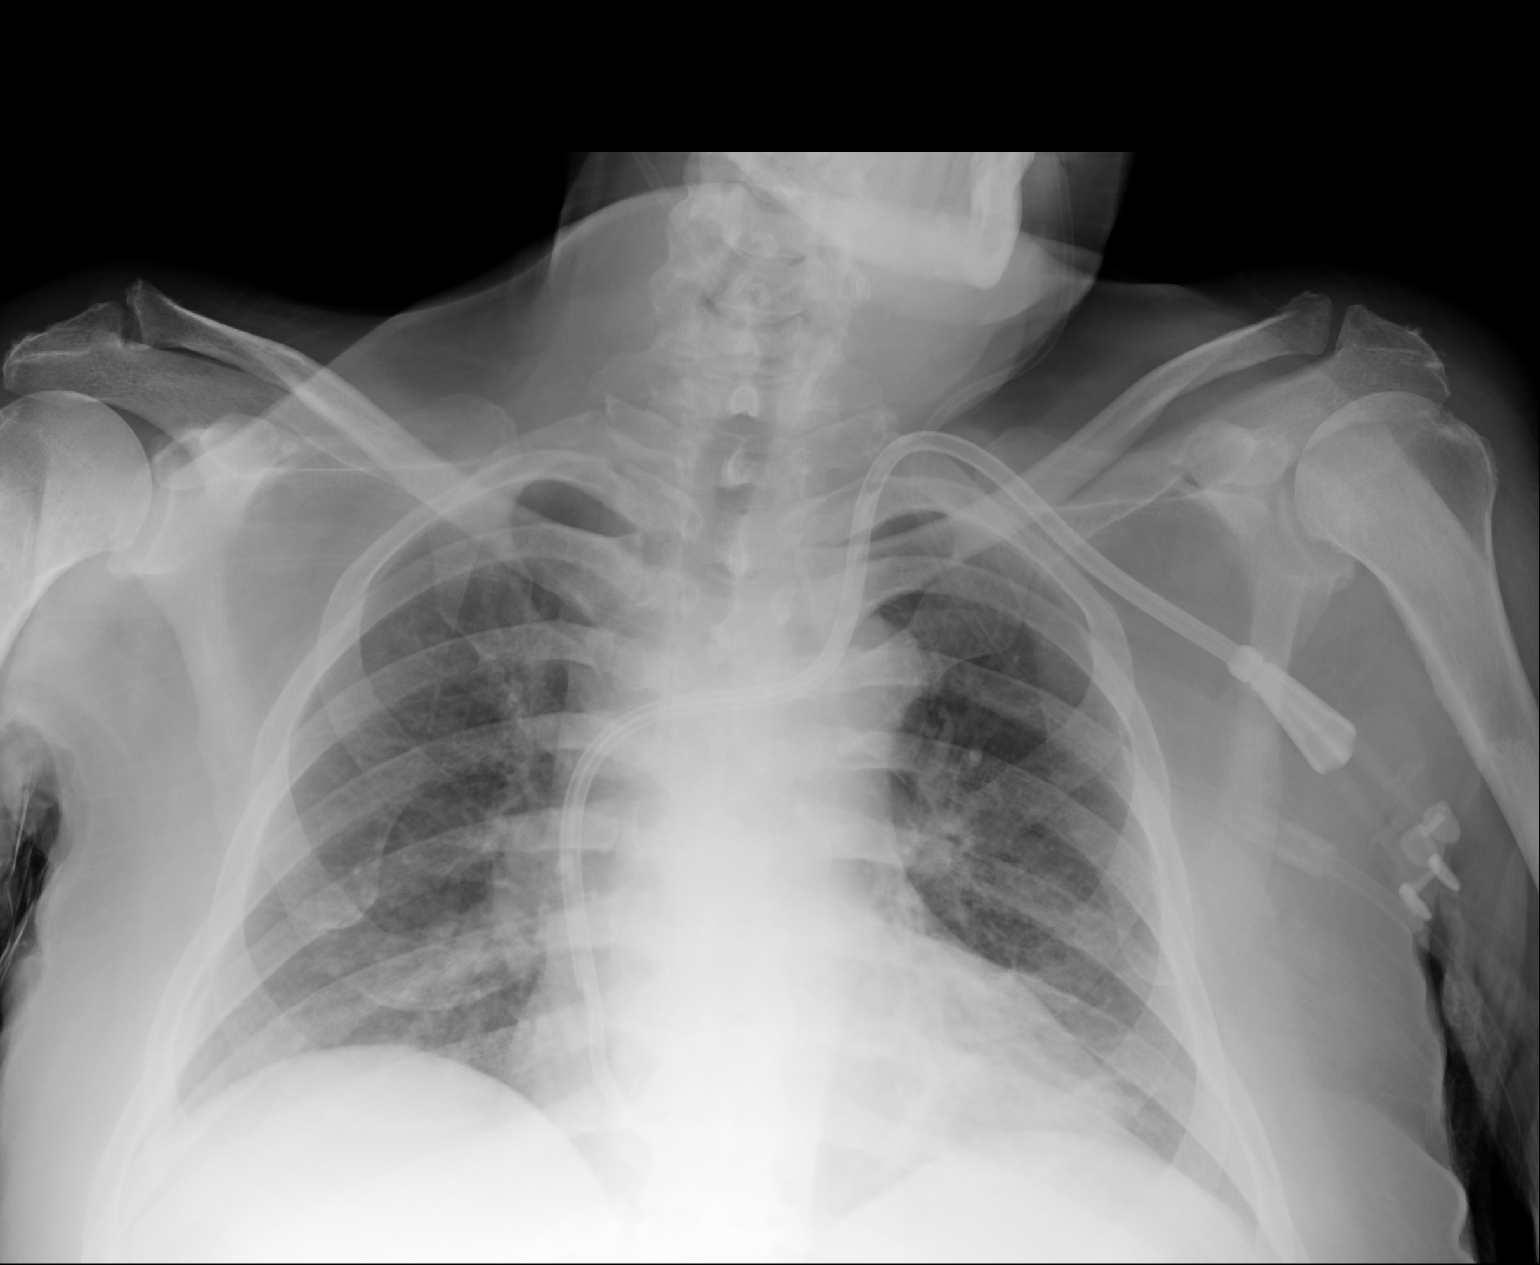

[1 of 1 positions shown; findings below may reference images not displayed]

IMPRESSION: No acute abnormalities. Dual-lumen catheter with tip in
right atrium. Chest stable from 01/18/2013.

## 2014-08-20 NOTE — Consult Note (Signed)
Admit Reason:   Fournier's gangrene: (608.83) Active, ICD9, Specified vascular disorder of male genital organs    Present Illness 54yo M admitted 04/10/12 per Dr. Virl DiamondStoiff w/ Fournier gangrene s/p I&D 04/10/12.  I was consulted to manage his underlying diabetes.  Pt also found to have acute on chronic renal failure. Overall feeling better today; no overnight events    No Known Allergies:   Case History and Physical Exam:   Chief Complaint Scrotal pain    Past Medical Health Diabetes Mellitus    [facility]  Dr. Burnadette PopLinthavong    Family History Non-Contributory    HEENT PERLA    Neck/Nodes Supple    Chest/Lungs no inc work of breathing    Cardiovascular Normal Sinus Rhythm    Abdomen Benign    Genitalia Not examined    Neurological Grossly WNL     Impression 1. Fournier gangrene s/p I&D (04/10/12)- Improving s/p procedure.  Agree w/ current abx regimen.  Managed by urology.  2. AODM- Uncontrolled.  Will review office notes.  Cover w/ SSI and inc lantus to 10 units subcutaneous qhs.  Hold oral agent.  Follow CBGs.  ADA diet.  3. Acute on chronic renal failure- New.  Defer management to nephrology.  Workup pending.  Cont w/ IVF.  4. FEN- Mild hypokalemia.  Replete in IVF.  5. Dispo- DNR/DNI.  Hospital course pending urology.   Electronic Signatures: Marisue IvanLinthavong, Loron Weimer (MD)  (Signed 10-Dec-13 13:12)  Authored: Health Issues, General Aspect/Present Illness, Allergies, History and Physical Exam, Impression/Plan   Last Updated: 10-Dec-13 13:12 by Marisue IvanLinthavong, Sarahelizabeth Conway (MD)

## 2014-08-20 NOTE — Consult Note (Signed)
    Present Illness 54yo M admitted 04/10/12 per Dr. Virl DiamondStoiff w/ Fournier gangrene s/p I&D 04/10/12.  I was consulted to manage his underlying diabetes.  Pt also found to have acute on chronic renal failure. Still having significant scrotal swelling; s/p IJ placement w/o complications;  no overnight events;    No Known Allergies:   Case History and Physical Exam:   Chief Complaint Scrotal pain/swelling    Past Medical Health Diabetes Mellitus    [facility]  Dr. Burnadette PopLinthavong    Family History Non-Contributory    HEENT PERLA    Neck/Nodes Supple    Chest/Lungs no inc work of breathing    Cardiovascular Normal Sinus Rhythm    Abdomen Benign    Genitalia significant scrotal edema and erythema; unable to visualize the penis    Neurological Grossly WNL     Impression 1. Fournier gangrene s/p I&D (04/10/12)-  s/p procedure.  Abx managed by urology.  Unable to visualize the penis; Awaiting further urology recommendations; s/p IJ placement for longterm IV abx per Dr. Sharrell KuBlocker's recommendtations.    2. AODM w/ neuropathy- Improving. A1c 14.8%.  Cover w/ SSI, lantus 12 units subcutaneous qhs.  Hold oral agent.  Follow CBGs.  ADA diet.  Restarted gabapentin 300mg  tid.  3. Acute on chronic renal failure- Slowly improving.  Defer management to nephrology.  Workup pending. U/S neg. Cont w/ IVF.  Chronic renal failure secondary to uncontrolled DM.  4. HTN- New.  Holding ACE-I due to ARF.  Tx: amlodipine 2.5mg  po daily.  5. FEN- Mild hypokalemia improved.  Replete in IVF.  Hypocalcemia - secondary to low albumin.  6. Dispo- DNR/DNI.  Hospital course pending urology.  Awaiting ID recommendations.   Electronic Signatures: Marisue IvanLinthavong, Lilyian Quayle (MD)  (Signed 16-Dec-13 12:51)  Authored: General Aspect/Present Illness, Allergies, History and Physical Exam, Impression/Plan   Last Updated: 16-Dec-13 12:51 by Marisue IvanLinthavong, Viren Lebeau (MD)

## 2014-08-20 NOTE — Consult Note (Signed)
Impression: 54yo male w/ h/o DM, CRI, osteomyelitis of left foot admitted with Fournier's Gangrene.  This infection is characterized by mixed aerobic and anaerobic organisms.  This is what has been recovered on culture.  Therapy is surgical and antibiotics.  Without surgery, mortality is around 100%.  He has undergone surgery on two seperate occasions with improvement seen at the margins on the second procedure.  His WBC remains elevated however. Will repeat his CBC in the am.  I would like to see his WBC definately trending downwards. If his WBC is better tomorrow, will consider changing to po antibiotics.  Once the necrotic edges have been debrided, there is not a necessity to treat with IV therapy.  He is not very complaint and IV therapy may put him at increased risk for complications. If his WBC worsens, will need to consider further exploratory surgery or imaging looking for additional necrotic tissue. 5) Given the organisms recovered intraoperatively, cefazolin and metronidazole will be sufficient to cover the aerobic and anaerobic orgnisms.  Will d/c the unasyn.  Electronic Signatures: Anatasia Tino, Rosalyn GessMichael E (MD) (Signed on 16-Dec-13 16:00)  Authored   Last Updated: 16-Dec-13 16:12 by Allissa Albright, Rosalyn GessMichael E (MD)

## 2014-08-20 NOTE — Consult Note (Signed)
    Present Illness 54yo M admitted 04/10/12 per Dr. Virl DiamondStoiff w/ Fournier gangrene s/p I&D 04/10/12.  I was consulted to manage his underlying diabetes.  Pt also found to have acute on chronic renal failure. Scrotal pain and swelling improving; complaining of lower ext edema; pt anxious and wants to go home    No Known Allergies:   Case History and Physical Exam:   Chief Complaint Scrotal pain/swelling    Past Medical Health Diabetes Mellitus    [facility]  Dr. Burnadette PopLinthavong    Family History Non-Contributory    HEENT PERLA    Neck/Nodes Supple    Chest/Lungs no inc work of breathing    Cardiovascular Normal Sinus Rhythm    Abdomen Benign    Genitalia improved appearance of scrotal edema and erythema    Neurological Grossly WNL    Skin 1+ edema of lower ext b/l     Impression 1. Fournier gangrene s/p I&D (04/10/12)-  s/p procedure x 2.  Improving.  Awaiting further urology recommendations - w/ regards to further surgical intervention; Dr. Leavy CellaBlocker managing abx - advised to transition to oral abx.  WBC trending down.  2. AODM w/ neuropathy- Stable. A1c 14.8%.  Cover w/ SSI, lantus 12 units subcutaneous qhs.  Hold oral agent.  Follow CBGs.  ADA diet.  Restarted gabapentin 300mg  tid.  3. Acute on chronic renal failure- Stabilizing.  Appears to be at his baseline.  Defer management to nephrology.  Workup neg. U/S neg. Cont w/ IVF.  Chronic renal failure secondary to uncontrolled DM.  4. HTN- New.  Holding ACE-I due to ARF.  Tx: amlodipine 2.5mg  po daily.  Transition to oral lasix 20mg  prn.  5. FEN- Mild hypokalemia improved.  Replete in IVF.  Hypocalcemia - secondary to low albumin.  6. Dispo- DNR/DNI.  Hospital course pending urology.  Possible d/c today.   Electronic Signatures: Marisue IvanLinthavong, Tava Peery (MD)  (Signed 19-Dec-13 07:45)  Authored: General Aspect/Present Illness, Allergies, History and Physical Exam, Impression/Plan   Last  Updated: 19-Dec-13 07:45 by Marisue IvanLinthavong, Kyheem Bathgate (MD)

## 2014-08-20 NOTE — Consult Note (Signed)
    Present Illness 54yo M admitted 04/10/12 per Dr. Virl DiamondStoiff w/ Fournier gangrene s/p I&D 04/10/12.  I was consulted to manage his underlying diabetes.  Pt also found to have acute on chronic renal failure. Still having scrotal swelling and discomfort; no overnight events;    No Known Allergies:   Case History and Physical Exam:   Chief Complaint Scrotal pain    Past Medical Health Diabetes Mellitus    [facility]  Dr. Burnadette PopLinthavong    Family History Non-Contributory    HEENT PERLA    Neck/Nodes Supple    Chest/Lungs no inc work of breathing    Cardiovascular Normal Sinus Rhythm    Abdomen Benign    Genitalia significant scrotal edema and erythema    Neurological Grossly WNL     Impression 1. Fournier gangrene s/p I&D (04/10/12)-  s/p procedure.  Abx managed by urology.    2. AODM w/ neuropathy- Improving.A1c 14.8%.  Cover w/ SSI, lantus 12 units subcutaneous qhs.  Hold oral agent.  Follow CBGs.  ADA diet.  Restarted gabapentin 300mg  tid.  3. Acute on chronic renal failure- Slight improvement  Defer management to nephrology.  Workup pending. U/S neg. Cont w/ IVF.  Chronic renal failure secondary to uncontrolled DM.  4. FEN- Mild hypokalemia improved.  Replete in IVF.  Hypocalcemia - secondary to low albumin.  5. Dispo- DNR/DNI.  Hospital course pending urology.   Electronic Signatures: Marisue IvanLinthavong, Krizia Flight (MD)  (Signed 14-Dec-13 11:50)  Authored: General Aspect/Present Illness, Allergies, History and Physical Exam, Impression/Plan   Last Updated: 14-Dec-13 11:50 by Marisue IvanLinthavong, Parris Cudworth (MD)

## 2014-08-20 NOTE — Discharge Summary (Signed)
PATIENT NAME:  [name] [name], [name] [name] MR#:  [id number] DATE OF BIRTH:  05-Jan-1961  DATE OF ADMISSION:  04/10/2012 DATE OF DISCHARGE:  04/20/2012  ADMISSION DIAGNOSES: 1. Fournier gangrene.  2. Diabetes mellitus.  3. Acute on chronic renal failure.   DISCHARGE DIAGNOSES: 1. Fournier gangrene.  2. Diabetes mellitus.  3. Acute on chronic renal failure.   PROCEDURES:  1. On 04/10/2012, incision and drainage and debridement of abscess, necrotizing fasciitis of the ischiorectal fossa. 2. On 04/12/2012, examination under anesthesia and minor debridement of necrotizing fasciitis of the ischiorectal fossa.   CONSULTANTS:  1. Marisue IvanKanhka Linthavong, MD - Internal Medicine. 2. Mady HaagensenMunsoor Lateef, MD - Nephrology.  3. Wound Care Team.  4. Orson AloeMichael Blocker, MD - Infectious Disease.   HISTORY OF PRESENT ILLNESS: This is a 54 year old male who presented to the Emergency Department on 04/10/2012 with two-day history of pain, redness and drainage of his right groin region. Refer to the admission H and P for details.   HOSPITAL COURSE: Examination was consistent with Fournier gangrene. He was taken to the operating room the day of admission with findings of an abscess and necrotizing fasciitis of the ischiorectal fossa. There was no involvement of the scrotum. Drainage and debridement was performed and packing was placed. Internal medicine and nephrology consultations were obtained for evaluation and management of his diabetes and acute on chronic renal insufficiency. A renal ultrasound showed no hydronephrosis. On the first postoperative day, the dressing was changed. He had max temperature of 99. He was taken back to the operating room on 04/12/2012 for exam under anesthesia and a second look with only minor debridement required. Consultation was obtained from the wound care team. He underwent daily dressing changes. He did develop erythema in the groin region consistent with fungal skin infection and was treated with  nystatin. His creatinine slowly decreased and at time of discharge was approaching baseline. Final cultures grew streptococcus agalactiae, Escherichia coli, Clostridium bifermentans and Bacteroides vulgatus.  Initial antibiotic management included Rocephin, Unasyn and Flagyl. He was subsequently switched to Ancef and Flagyl. Although he continued to improve, his white count remained elevated. Infectious disease consultation was obtained for recommendations of outpatient antibiotic therapy. His white count was decreased at the time of discharge and was slightly elevated at 14. On the day of discharge, he was afebrile. His surgical site was clean. Creatinine was 2.25, which was slightly elevated above his baseline of 1.8. WBC was 14.2.   DISCHARGE MEDICATIONS: As per the orders reconciliation.   DISCHARGE INSTRUCTIONS/FOLLOWUP: He will have daily visits in the [facility] for wound care. He will follow up with me in one week. He was instructed to contact the office for fever, increased pain or drainage from his surgical site.   CONDITION AT DISCHARGE: Stable.  ____________________________ Verna CzechScott C. Lonna CobbStoioff, MD scs:sb D: 04/27/2012 07:58:52 ET T: 04/27/2012 08:49:50 ET JOB#: [id number]  cc: Lorin PicketScott C. Lonna CobbStoioff, MD, <Dictator> Riki AltesSCOTT C Jennings Corado MD ELECTRONICALLY SIGNED 04/27/2012 12:51

## 2014-08-20 NOTE — Consult Note (Signed)
    Present Illness 54yo M admitted 04/10/12 per Dr. Virl DiamondStoiff w/ Fournier gangrene s/p I&D 04/10/12.  I was consulted to manage his underlying diabetes.  Pt also found to have acute on chronic renal failure. Scrotal pain and swelling improving; complaining of lower ext edema; pt anxious and wants to go home    No Known Allergies:   Case History and Physical Exam:   Chief Complaint Scrotal pain/swelling    Past Medical Health Diabetes Mellitus    [facility]  Dr. Burnadette PopLinthavong    Family History Non-Contributory    HEENT PERLA    Neck/Nodes Supple    Chest/Lungs no inc work of breathing    Cardiovascular Normal Sinus Rhythm    Abdomen Benign    Genitalia improved appearance of scrotal edema and erythema    Neurological Grossly WNL    Skin 1+ edema of lower ext b/l     Impression 1. Fournier gangrene s/p I&D (04/10/12)-  s/p procedure x 2.  Improving.  Awaiting further urology recommendations - w/ regards to further surgical intervention; Dr. Leavy CellaBlocker managing abx - cont w/ IV abx.  2. AODM w/ neuropathy- Improving. A1c 14.8%.  Cover w/ SSI, lantus 12 units subcutaneous qhs.  Hold oral agent.  Follow CBGs.  ADA diet.  Restarted gabapentin 300mg  tid.  3. Acute on chronic renal failure- Slowly improving.  Defer management to nephrology.  Workup neg. U/S neg. Cont w/ IVF.  Chronic renal failure secondary to uncontrolled DM.  4. HTN- New.  Holding ACE-I due to ARF.  Tx: amlodipine 2.5mg  po daily.  Start lasix 20mg  IV daily for edema.  5. FEN- Mild hypokalemia improved.  Replete in IVF.  Hypocalcemia - secondary to low albumin.  6. Dispo- DNR/DNI.  Hospital course pending urology.   Electronic Signatures: Marisue IvanLinthavong, Leler Brion (MD)  (Signed 18-Dec-13 08:15)  Authored: General Aspect/Present Illness, Allergies, History and Physical Exam, Impression/Plan   Last Updated: 18-Dec-13 08:15 by Marisue IvanLinthavong, Rasheen Bells (MD)

## 2014-08-20 NOTE — Consult Note (Signed)
    Present Illness 54yo M admitted 04/10/12 per Dr. Virl DiamondStoiff w/ Fournier gangrene s/p I&D 04/10/12.  I was consulted to manage his underlying diabetes.  Pt also found to have acute on chronic renal failure. Still having scrotal swelling and discomfort; plans to proceed to OR today per Dr. Virl DiamondStoiff    No Known Allergies:   Case History and Physical Exam:   Chief Complaint Scrotal pain    Past Medical Health Diabetes Mellitus    [facility]  Dr. Burnadette PopLinthavong    Family History Non-Contributory    HEENT PERLA    Neck/Nodes Supple    Chest/Lungs no inc work of breathing    Cardiovascular Normal Sinus Rhythm    Abdomen Benign    Genitalia Not examined    Neurological Grossly WNL     Impression 1. Fournier gangrene s/p I&D (04/10/12)-  s/p procedure.  Agree w/ current abx regimen.  Managed by urology.  Plans to proceed back to OR.  2. AODM w/ neuropathy- Uncontrolled.  Last A1c >17% in the office 02/2012.  Cover w/ SSI and inc lantus to 10 units subcutaneous qhs.  Hold oral agent.  Follow CBGs.  ADA diet.  Restart gabapentin 300mg  tid.  3. Acute on chronic renal failure- Unchanged.  Defer management to nephrology.  Workup pending.  Cont w/ IVF.  Chronic renal failure secondary to uncontrolled DM.  4. FEN- Mild hypokalemia improved.  Replete in IVF.  Hypocalcemia - corrected for low albumin w/ value of 8.1  5. Dispo- DNR/DNI.  Hospital course pending urology.   Electronic Signatures: [name] [name], Sheilla Maris (MD)  (Signed 11-Dec-13 07:55)  Authored: General Aspect/Present Illness, Allergies, History and Physical Exam, Impression/Plan   Last Updated: 11-Dec-13 07:55 by [name] [name], Sharaine Delange (MD)

## 2014-08-20 NOTE — Consult Note (Signed)
    Present Illness 54yo M admitted 04/10/12 per Dr. Virl DiamondStoiff w/ Fournier gangrene s/p I&D 04/10/12.  I was consulted to manage his underlying diabetes.  Pt also found to have acute on chronic renal failure. Still having significant scrotal swelling; s/p IJ placement w/o complications;  no overnight events;    No Known Allergies:   Case History and Physical Exam:   Chief Complaint Scrotal pain/swelling    Past Medical Health Diabetes Mellitus    [facility]  Dr. Burnadette PopLinthavong    Family History Non-Contributory    HEENT PERLA    Neck/Nodes Supple    Chest/Lungs no inc work of breathing    Cardiovascular Normal Sinus Rhythm    Abdomen Benign    Genitalia significant scrotal edema and erythema; unable to visualize the penis    Neurological Grossly WNL     Impression 1. Fournier gangrene s/p I&D (04/10/12)-  s/p procedure.  Abx managed by urology.  Unable to visualize the penis; Awaiting further urology recommendations; s/p IJ placement for longterm IV abx per Dr. Sharrell KuBlocker's recommendtations.    2. AODM w/ neuropathy- Improving. A1c 14.8%.  Cover w/ SSI, lantus 12 units subcutaneous qhs.  Hold oral agent.  Follow CBGs.  ADA diet.  Restarted gabapentin 300mg  tid.  3. Acute on chronic renal failure- Slowly improving.  Defer management to nephrology.  Workup pending. U/S neg. Cont w/ IVF.  Chronic renal failure secondary to uncontrolled DM.  4. HTN- New.  Holding ACE-I due to ARF.  Tx: amlodipine 2.5mg  po daily.  5. FEN- Mild hypokalemia improved.  Replete in IVF.  Hypocalcemia - secondary to low albumin.  6. Dispo- DNR/DNI.  Hospital course pending urology.   Electronic Signatures: Marisue IvanLinthavong, Niv Darley (MD)  (Signed 17-Dec-13 08:02)  Authored: General Aspect/Present Illness, Allergies, History and Physical Exam, Impression/Plan   Last Updated: 17-Dec-13 08:02 by Marisue IvanLinthavong, Kassidy Frankson (MD)

## 2014-08-20 NOTE — Consult Note (Signed)
    Present Illness 54yo M admitted 04/10/12 per Dr. Virl DiamondStoiff w/ Fournier gangrene s/p I&D 04/10/12.  I was consulted to manage his underlying diabetes.  Pt also found to have acute on chronic renal failure. Still having significant scrotal swelling; no foley in place; no overnight events;    No Known Allergies:   Case History and Physical Exam:   Chief Complaint Scrotal pain/swelling    Past Medical Health Diabetes Mellitus    [facility]  Dr. Burnadette PopLinthavong    Family History Non-Contributory    HEENT PERLA    Neck/Nodes Supple    Chest/Lungs no inc work of breathing    Cardiovascular Normal Sinus Rhythm    Abdomen Benign    Genitalia significant scrotal edema and erythema; unable to visualize the penis    Neurological Grossly WNL     Impression 1. Fournier gangrene s/p I&D (04/10/12)-  s/p procedure.  Abx managed by urology.  Unable to visualize the penis this AM and no foley in place; will have urology evaluate today and manage.  Anticipate home w/ IV abx - will order PICC placement.  2. AODM w/ neuropathy- Improving. A1c 14.8%.  Cover w/ SSI, lantus 12 units subcutaneous qhs.  Hold oral agent.  Follow CBGs.  ADA diet.  Restarted gabapentin 300mg  tid.  3. Acute on chronic renal failure- Slowly improving.  Defer management to nephrology.  Workup pending. U/S neg. Cont w/ IVF.  Chronic renal failure secondary to uncontrolled DM.  4. HTN- New.  Holding ACE-I due to ARF.  Tx: amlodipine 2.5mg  po daily.  5. FEN- Mild hypokalemia improved.  Replete in IVF.  Hypocalcemia - secondary to low albumin.  6. Dispo- DNR/DNI.  Hospital course pending urology.  Awaiting PICC placement.   Electronic Signatures: Marisue IvanLinthavong, Novalyn Lajara (MD)  (Signed 15-Dec-13 09:28)  Authored: General Aspect/Present Illness, Allergies, History and Physical Exam, Impression/Plan   Last Updated: 15-Dec-13 09:28 by Marisue IvanLinthavong, Chessica Audia (MD)

## 2014-08-20 NOTE — Op Note (Signed)
PATIENT NAME:  [name] [name], [name] [name] MR#:  [id number] DATE OF BIRTH:  1960-11-05  DATE OF PROCEDURE:  04/14/2012  PREOPERATIVE DIAGNOSIS: Fournier's gangrene.  POSTOPERATIVE DIAGNOSIS: Fournier's gangrene.  PROCEDURE: Examination under anesthesia with debridement of necrotizing fascial infection.   SURGEON: Irineo AxonScott Kanaya Gunnarson, MD  INDICATIONS: This is a 54 year old male who presented with Fournier's gangrene of the right groin region extending into the ischial rectal fossa. The scrotum was not involved. He had debridement of skin, subcutaneous tissue, fascia and some muscle. He presents for a second look and possible further debridement.   DESCRIPTION OF PROCEDURE: The patient was taken to the Operating Room where a general anesthetic was administered. He was placed in the low lithotomy position and his external genitalia were prepped and draped. Prior packing/dressing was removed from the surgical site. Skin flap created was necrotic in appearance and this was removed sharply. The bed was closely examined and there were slight necrotic areas requiring minor debridement, but for the most part the site looked very good. The site was copiously irrigated with sterile saline. It was repacked with Kerlix gauze and an ABD. He was taken to PAC-U in stable condition. There were no complications. EBL minimal.  ____________________________ Verna CzechScott C. Lonna CobbStoioff, MD scs:slb D: 04/14/2012 18:20:29 ET T: 04/15/2012 10:43:39 ET JOB#: [id number]  cc: Lorin PicketScott C. Lonna CobbStoioff, MD, <Dictator> Riki AltesSCOTT C Pearly Bartosik MD ELECTRONICALLY SIGNED 04/19/2012 9:53

## 2014-08-20 NOTE — Consult Note (Signed)
PATIENT NAME:  [name] [name], [name] [name] MR#:  [id number] DATE OF BIRTH:  [birth year]  DATE OF CONSULTATION:  04/17/2012  REFERRING PHYSICIAN:  Dr. Lonna Cobb  CONSULTING PHYSICIAN:  Rosalyn Gess. Velna Hedgecock, MD  REASON FOR CONSULTATION: Fournier's gangrene.   HISTORY OF PRESENT ILLNESS: The patient is a 54 year old man with a past history significant for diabetes, chronic renal insufficiency and osteomyelitis of the left foot who was admitted on December 9th with pain, swelling and redness of the left side of the scrotum. The patient states that the symptoms began approximately a month ago but worsened over the last three days. He had not had any treatment as he kept hoping it was going to improve. The swelling had gotten so significant that he could not see his penis. He has stated that his sugars have been up and down but have not been significantly higher than 250. He had had some purulent drainage from the scrotum as well prior to admission. He was admitted to the hospital and taken to the Operating Room for debridement. Purulent material was drained from the left side of the scrotum, and cultures have grown polymicrobial organisms suggestive of Fournier's gangrene. He had several areas of necrosis that were debrided. He went back to the Operating Room on December 13th for re-evaluation. At that time, there was some necrosis of the skin flap and minimal other areas that were necrotic that were debrided, but the wound appeared to look good, per the operative note, He has remained afebrile during his hospital stay. He was initially given clindamycin and Unasyn and vancomycin. He subsequently has been on metronidazole and Unasyn, and cefazolin was added. Currently, he is on metronidazole, cefazolin and Unasyn.   ALLERGIES: None.   PAST MEDICAL HISTORY: 1. Diabetes.  2. Gangrene of the left foot, status post metatarsal amputation.  3. Chronic renal insufficiency.  4. Osteomyelitis of the left foot in June 2011, status  post IV surgical intervention and IV antibiotics   SOCIAL HISTORY: He does not smoke. He does not drink. No history of injecting drug use.   FAMILY HISTORY: Positive for strokes and diabetes.    REVIEW OF SYSTEMS:  GENERAL: No fevers, chills, sweats, but some malaise.  HEAD, EARS, EYES, NOSE and THROAT: No headache. No sinus congestion. No sore throat.  NECK: No stiffness. No swollen glands.  RESPIRATORY: No cough, no shortness of breath. No sputum production.  CARDIAC: No chest pains or palpitations.  GASTROINTESTINAL: No nausea, no vomiting, no abdominal pain. He has been having some loose stool prior to admission and this has continued.  GENITOURINARY: He has had no dysuria or increased frequency. He has had some difficulty urinating because the swelling in the scrotum made finding his penis and directing the stream difficult. He had had a Foley catheter in place until yesterday, and this has been discontinued.  MUSCULOSKELETAL: No complaints.  SKIN: She has had swelling and drainage from the scrotum.  NEUROLOGIC: No focal weakness.  PSYCHIATRIC: No complaints. All other systems are negative.   PHYSICAL EXAMINATION: VITAL SIGNS: T-max of 99.4, T-current of 97.7, pulse of 90, blood pressure 129/77, 94% on room air.  GENERAL: A 54 year old white man in no acute distress.  HEAD, EARS, EYES, NOSE and THROAT: Normocephalic, atraumatic. Pupils are equal and reactive to light. Extraocular motion is intact. Sclerae, conjunctivae, and lids are without evidence for emboli or petechiae. Oropharynx shows no erythema or exudate. Teeth and gums are in fair condition.  NECK: Supple. Full range of motion. Midline trachea.  No lymphadenopathy. No thyromegaly.  LUNGS: Clear to auscultation bilaterally with good air movement. No focal consolidation.  HEART: Regular rate and rhythm without murmur, rub, or gallop.  ABDOMEN: Soft, minimally tender, but no rebound or guarding. No hepatosplenomegaly. No hernia  is noted.  EXTREMITIES: No evidence for tenosynovitis.  SKIN: He had marked scrotal edema bilaterally. There was a surgical wound that was not directly observed. The scrotal edema was so large that his penis was not able to be visualized. There was an area of approximately 2 cm x 1 cm of a thickened vesicle on the left anterior aspect of the scrotum. This was not draining any purulent material and was only minimally tender. There was no significant surrounding erythema around this area. There were no stigmata of endocarditis, specifically no Janeway lesions nor Osler nodes.  NEUROLOGIC: The patient was awake and interactive, moving all four extremities.  PSYCHIATRIC: Mood and affect appeared normal.  LABORATORY, DIAGNOSTIC AND RADIOLOGICAL DATA: BUN 25, creatinine 2.52, bicarbonate 29, anion gap of 9.0. White count of 17.3, with a hemoglobin of 7.5, platelet count of 533, ANC of 13.9. White count on admission was 22.9. His white count came down to as low as 15.9 on December 14th but yesterday was up to 24.1.  A C. diff PCR on December 13th was negative. Intraoperative cultures from December 9th are growing in one culture group B strep, E. coli, enterococcus, Bacteroides species, and the other culture group B strep, E. coli, Clostridium bifermentans and Bacteroides species. Blood cultures from admission show no growth.   A  chest x-ray shows hypoinflation but no obvious infiltrates. A renal ultrasound shows no hydronephrosis.   IMPRESSION: The patient is a 54 year old male with a history of diabetes and chronic renal insufficiency, osteomyelitis of the left foot, admitted with Fournier's gangrene.   RECOMMENDATIONS: 1. This infection is characterized by mixed aerobic and anaerobic organisms. This is what has been recovered on culture. Therapy is surgical and antibiotics. Without surgery, mortality is around 100%. He has undergone surgery on two separate occasions with improvement seen in the second  procedure with no significant necrosis at the margins. His white count remains elevated, however.  2. We will repeat his CBC in the morning. I would like to see his white count definitely trending downwards.  3. If his white count is better tomorrow, we will consider changing to p.o. antibiotics. Once the necrotic edges have been debrided, there is not a necessity to treat with IV therapy. He is not very compliant and IV therapy may put him at increased risk for complications.  4. If his white count worsens, then we will need to consider further exploratory surgery or imaging looking for additional necrotic tissue.  5. Given the organisms recovered intraoperatively, cefazolin and metronidazole will be sufficient to cover the aerobic and anaerobic organisms. We will discontinue the Unasyn. While typically clindamycin is given in case of necrotizing fasciitis to stop protein production, at this point this does not appear to be an issue.   This is a highly complex infectious disease case. Thank you very much for involving me in Mr. Sofranko's  care.  ____________________________ Rosalyn GessMichael E. Stephan Draughn, MD meb:cb D: 04/17/2012 16:14:31 ET T: 04/18/2012 09:50:00 ET JOB#: [id number] cc: Rosalyn GessMichael E. Lanessa Shill, MD, <Dictator> Felice Deem E Langford Carias MD ELECTRONICALLY SIGNED 04/18/2012 14:50

## 2014-08-20 NOTE — Consult Note (Signed)
    Present Illness 54yo M admitted 04/10/12 per Dr. Virl DiamondStoiff w/ Fournier gangrene s/p I&D 04/10/12.  I was consulted to manage his underlying diabetes.  Pt also found to have acute on chronic renal failure. Still having scrotal swelling and discomfort; no overnight events;    No Known Allergies:   Case History and Physical Exam:   Chief Complaint Scrotal pain    Past Medical Health Diabetes Mellitus    [facility]  Dr. Burnadette PopLinthavong    Family History Non-Contributory    HEENT PERLA    Neck/Nodes Supple    Chest/Lungs no inc work of breathing    Cardiovascular Normal Sinus Rhythm    Abdomen Benign    Genitalia significant scrotal edema and erythema    Neurological Grossly WNL     Impression 1. Fournier gangrene s/p I&D (04/10/12)-  s/p procedure.  Both cultures sensitive to Ceftaz...asked pharmacy to switch and renal dose.  Managed by urology.    2. AODM w/ neuropathy- Improving.A1c 14.8%.  Cover w/ SSI and inc lantus to 12 units subcutaneous qhs.  Hold oral agent.  Follow CBGs.  ADA diet.  Restarted gabapentin 300mg  tid.  3. Acute on chronic renal failure- Slight improvement  Defer management to nephrology.  Workup pending. U/S neg. Cont w/ IVF.  Chronic renal failure secondary to uncontrolled DM.  4. FEN- Mild hypokalemia improved.  Replete in IVF.  Hypocalcemia - secondary to low albumin.  5. Dispo- DNR/DNI.  Hospital course pending urology.   Electronic Signatures: [name] [name], [name] [name] (MD)  (Signed 13-Dec-13 13:02)  Authored: General Aspect/Present Illness, Allergies, History and Physical Exam, Impression/Plan   Last Updated: 13-Dec-13 13:02 by [name] [name], [name] [name] (MD)

## 2014-08-20 NOTE — H&P (Signed)
PATIENT NAME:  [name] [name], [name] [name] MR#:  [id number] DATE OF BIRTH:  01-Jul-1960  DATE OF ADMISSION:  04/10/2012  CHIEF COMPLAINT: Scrotal pain and swelling.   HISTORY OF PRESENT ILLNESS: 54 year old male who states on 04/07/2012 he had onset of left hemiscrotal pain, swelling and redness. His swelling has progressed over the last three days. He has noted purulent drainage coming from the scrotum. He denies fever, chills, shortness of breath. He has noted some intermittent rectal bleeding. He is having voiding difficulty due to scrotal swelling from retraction of the penis. He denies any previous history of urologic problems or similar presenting symptoms. He does have adult onset diabetes.   PAST MEDICAL HISTORY: Adult onset diabetes.   PAST SURGICAL HISTORY: Toe amputation.   MEDICATIONS ON ADMISSION:  1. Glipizide 10 mg b.i.d.  2. Gabapentin 300 mg b.i.d.  3. ASA 81 mg daily.   ALLERGIES: No known drug allergies.   SOCIAL HISTORY: Nonsmoker. No alcohol use.   REVIEW OF SYSTEMS: CONSTITUTIONAL: Denies fever, chills, fatigue, malaise. EYES: No visual changes. HENT: No headache, nasal congestion, swallowing difficulty. NECK: Negative pain. PULMONARY: Denies cough, shortness of breath. CARDIOVASCULAR: Denies hypertension, chest pain, palpitations. ABDOMEN: Denies abdominal pain, nausea, vomiting. See history of present illness. GENITOURINARY: As per the history of present illness. HEME: No history of bleeding or clotting disorders. NEUROLOGIC: No cerebrovascular accident, seizure. LYMPHATIC: No swollen glands. PSYCHIATRIC: No depression, anxiety.  ALLERGIES: No known drug allergies.    PHYSICAL EXAMINATION:  VITAL SIGNS: Temperature 97.9, pulse 105, blood pressure 145/77.   GENERAL: Alert, not ill-appearing male in no acute distress.   HEENT: Moist mucous membranes. Oral cavity clear.   NECK: Supple without adenopathy.   CARDIOVASCULAR: Regular rate and rhythm without murmur.   LUNGS:  Clear to auscultation.   ABDOMEN: Obese, soft, nontender. No skin erythema of the abdominal wall.   GENITOURINARY: Phallus is retracted. There is marked bilateral scrotal erythema, thickening. There is significant enlargement of the left hemiscrotum with induration. On the right hemiscrotum is an area of fibrinous exudate which is draining thin purulent material.   RECTAL: There extension of the erythema on the perineum with mild induration and no fluctuance.   EXTREMITIES: No edema. Left toe amputation present.   NEUROLOGIC: No focal deficits.   LABORATORY, DIAGNOSTIC AND RADIOLOGICAL DATA: Glucose 303, creatinine 3.83. Electrolytes normal. WBC 22.9, hemoglobin and hematocrit 10.6/33 0.5, platelets 434.   IMPRESSION: Clinical presentation suspicious for Fournier gangrene.   RECOMMENDATION: Clinical findings were discussed in detail with the patient. Vancomycin, Unasyn and clindamycin was ordered by the ER staff. I have recommended examination under anesthesia, scrotal exploration and incision and drainage/debridement. The serious nature of this infection was discussed with the patient. The potential need for further debridement and prolonged course of wound care was discussed.     He indicated all questions were attached to his satisfaction and desires to proceed.   ____________________________ Verna CzechScott C. Lonna CobbStoioff, MD scs:cms D: 04/10/2012 11:16:50 ET T: 04/10/2012 11:26:30 ET JOB#: [id number]  cc: Lorin PicketScott C. Lonna CobbStoioff, MD, <Dictator> Riki AltesSCOTT C Tyrece Vanterpool MD ELECTRONICALLY SIGNED 04/19/2012 9:53

## 2014-08-20 NOTE — Op Note (Signed)
PATIENT NAME:  [name] [name], [name] [name] MR#:  [id number] DATE OF BIRTH:  Sep 13, 1960  DATE OF PROCEDURE:  04/10/2012  PREOPERATIVE DIAGNOSIS:  Fournier's gangrene.   POSTOPERATIVE DIAGNOSIS:  Fournier's gangrene.  PROCEDURE:  1. Incision and drainage of abscess of the right ischiorectal fossa.  2. Debridement of ischiorectal fossa.   INDICATIONS: 54 year old male presented to the Emergency Department this morning with a three-day history of scrotal swelling, erythema, and drainage. Examination was remarkable for enlargement of the left hemiscrotum with mild erythema, however, there was intense erythema and drainage lateral to the right hemiscrotum. He presents for incision and drainage/exploration.   DESCRIPTION OF PROCEDURE: The patient was taken to the operating room where a general anesthetic was administered. He was placed in the low lithotomy position and his external genitalia and perineum were prepped and draped.  On examination under anesthesia, the left hemi-scrotal swelling was soft, mobile, consistent with hydrocele. There was intense skin erythema without necrosis primarily lateral to the right hemiscrotum. There was breakdown with purulent drainage lateral to the right hemiscrotum with chronic tissue protruding through this area of breakdown. Rectal examination was performed. No mass or fluctuance was appreciated. The area of the skin breakdown was incised longitudinally and a moderate amount of thin purulent fluid was returned.  Culture swabs for aerobic, anaerobic, and Gram stain were obtained. There was a large amount of necrotic-appearing tissue within this area which was debrided. Using finger dissection, the abscess extended to the ischiorectal fossa. Further necrotic tissue was debrided. With the finger inserted in the rectum there appeared to be no communication with the rectum in this area and the planes remained intact.  Once all visible necrotic-appearing tissue was debrided, the site was  irrigated with GU irrigant and saline. Hemostasis was obtained with cautery. No further necrotic areas were identified.   Attention was then directed to the left hemiscrotum which was prepped with Betadine. A 30-mL syringe 25-gauge needle was inserted and clear hydrocele fluid was aspirated. No further exploration was performed in the left hemiscrotum as there was mild skin erythema which appeared to be an extension from the contralateral side. There was no evidence of purulent fluid. The right intrascrotal compartment was not involved with abscess and was palpably normal. The site was packed with four-inch clean gauze.  There was also a plane extending up into the right groin area; however, there was no necrotic-appearing tissue present in this area. Fluffs and mesh underwear were then applied. Of note, a Foley catheter was placed intraoperatively. There was narrowing within the distal meatus and a 16 French catheter could not be placed. A 12 French catheter was placed without problems. The patient was taken to the PAC-U in stable condition. There were no complications. EBL was approximately 50 mL.    ____________________________ Verna CzechScott C. Lonna CobbStoioff, MD scs:bjt D: 04/10/2012 17:59:34 ET T: 04/11/2012 10:05:58 ET JOB#: [id number]  cc: Lorin PicketScott C. Lonna CobbStoioff, MD, <Dictator> Riki AltesSCOTT C STOIOFF MD ELECTRONICALLY SIGNED 04/19/2012 9:53

## 2014-08-20 NOTE — Consult Note (Signed)
    Present Illness 54yo M admitted 04/10/12 per Dr. Virl DiamondStoiff w/ Fournier gangrene s/p I&D 04/10/12.  I was consulted to manage his underlying diabetes.  Pt also found to have acute on chronic renal failure. Still having scrotal swelling and discomfort; no overnight events;    No Known Allergies:   Case History and Physical Exam:   Chief Complaint Scrotal pain    Past Medical Health Diabetes Mellitus    [facility]  Dr. Burnadette PopLinthavong    Family History Non-Contributory    HEENT PERLA    Neck/Nodes Supple    Chest/Lungs no inc work of breathing    Cardiovascular Normal Sinus Rhythm    Abdomen Benign    Genitalia significant scrotal edema and erythema    Neurological Grossly WNL     Impression 1. Fournier gangrene s/p I&D (04/10/12)-  s/p procedure.  Agree w/ current abx regimen.  Managed by urology.    2. AODM w/ neuropathy- Uncontrolled.  Last A1c >17% in the office 02/2012.  Cover w/ SSI and inc lantus to 12 units subcutaneous qhs.  Hold oral agent.  Follow CBGs.  ADA diet.  Restarted gabapentin 300mg  tid.  3. Acute on chronic renal failure- Slight improvement  Defer management to nephrology.  Workup pending. U/S neg. Cont w/ IVF.  Chronic renal failure secondary to uncontrolled DM.  4. FEN- Mild hypokalemia improved.  Replete in IVF.  Hypocalcemia - secondary to low albumin.  5. Dispo- DNR/DNI.  Hospital course pending urology.   Electronic Signatures: Marisue IvanLinthavong, Vicci Reder (MD)  (Signed 12-Dec-13 07:40)  Authored: General Aspect/Present Illness, Allergies, History and Physical Exam, Impression/Plan   Last Updated: 12-Dec-13 07:40 by Marisue IvanLinthavong, Juno Alers (MD)

## 2014-08-20 NOTE — Consult Note (Signed)
PATIENT NAME:  [name] [name], [name] [name] MR#:  [id number] DATE OF BIRTH:  Dec 23, 1960  DATE OF CONSULTATION:  04/10/2012  REFERRING PHYSICIAN:     CONSULTING PHYSICIAN:  Aurther LoftAdaorah E. Ramesh Moan, DO  ATTENDING PHYSICIAN:  Dr. Lonna CobbStoioff PRIMARY CARE PHYSICIAN:  Dr. Steffanie RainwaterLinthavong, [facility]   REASON FOR CONSULTATION:  Diabetes management.  HISTORY OF PRESENT ILLNESS: 54 year old male with history of diabetes mellitus who presents with progressive left hemi-scrotal pain, swelling, and redness. The patient reports that he has had these symptoms for about a month and a half. However, over the preceding three days the pain became unbearable, prompting him to present to the Emergency Department. He actually notes that three days ago the pain was so severe that he had some urinary retention. However, he notes that beyond that he had been urinating well. He denies fevers but notes some intermittent chills. He notes that there has been some purulent drainage from the scrotum. Regarding his diabetes mellitus, he takes glipizide twice a day. He notes that his hemoglobin A1c was 14 about a month ago. He just started seeing Dr. Burnadette PopLinthavong around a month or so ago.  At this point the patient is status post incision and drainage of the scrotal abscess.  We are consulted to manage his diabetes mellitus.  Of note, the patient notes that he has had some rectal bleeding.  PAST MEDICAL HISTORY:  1. Diabetes.  2. Chronic kidney disease stage III noted on labs.  PAST SURGICAL HISTORY: Left toe amputations.   MEDICATIONS:  1. Glipizide 10 mg twice a day.  2. Gabapentin 300 mg twice a day.  3. Aspirin 81 mg daily.   ALLERGIES: No known drug allergies.   SOCIAL HISTORY: Denies alcohol or tobacco use.   FAMILY HISTORY: Notable for mother who had cancer of the lymph nodes.   REVIEW OF SYSTEMS: CONSTITUTIONAL: Denies fevers, chills, fatigue, malaise. EYES: Denies double vision or eye pain.  ENT: No tinnitus, ear pain, nasal  congestion, or sore throat. PULMONARY: Denies cough, shortness of breath, chest pain, or hemoptysis. CARDIOVASCULAR: Denies chest pain, palpitations, or leg swelling. ABDOMEN: Denies nausea, vomiting, abdominal pain, or diarrhea. GU: Admits to persistent scrotal edema, pain, erythema, and purulent drainage progressively over a month and a half duration, worsened acutely over the last three days. SKIN: Other than scrotal findings denies any other skin rashes.  MUSCULOSKELETAL: Denies myalgias, arthralgias, or joint swelling. NEURO: Denies numbness, headaches, or tremor. PSYCH: Denies anxiety or depression.   PHYSICAL EXAM:  VITAL SIGNS: Temperature 98.1, heart rate ranged between 103 and 107, respirations 18 to 20. Blood pressure 127/85, sating 96% on room air.   GENERAL: Caucasian male in no apparent distress. Well-appearing.   PSYCH: Awake, alert, oriented times three. Flat affect.    EYES: Pupils equal, round, and reactive to light and accommodation. Anicteric sclerae.    ENT: Moist mucosa. Normal external ears and nares.   PULMONARY: Clear to auscultation bilaterally. No wheezing, rales, or rhonchi. Normal effort.   CARDIOVASCULAR: S1, S2, regular rate and rhythm. No murmurs appreciated. No pedal edema.   ABDOMEN: Soft, nontender, nondistended. No hepatomegaly appreciated.   MUSCULOSKELETAL: Full range of motion of all extremities. The patient does have left toe amputations. Otherwise, no clubbing, no cyanosis.   GENITOURINARY:  There is a bulky dressing in place. This was not taken down.  This is postoperative day zero.   SKIN: No rashes identified. Warm and dry.   LABORATORY DATA: White count 22.9, hemoglobin 10.6, hematocrit 33.5, platelets 434  with an MCV of 79. Lipase is 169. Glucose 303, BUN 46, creatinine 3.83, increased from 1.71 in March. Sodium 136, potassium 3.6, chloride 101, bicarbonate 24, calcium 7.4, bilirubin 0.3, alkaline phosphatase 417, ALT 40, AST 46, total protein  is 7.6, albumin 1.5, osmolality 295, GFR 17, anion gap is 11. INR 1.1.   ABO blood type is A+ with antibody screen being negative. Accu-Chek at 15:17 with blood glucose 195. Urinalysis showed 1+ blood, greater than 500 protein, negative nitrites, and leukocyte esterase.  Accu-Chek at 16:56 was 214.   ASSESSMENT AND PLAN: 54 year old gentleman admitted with scrotal abscess status post I and D, also with acute on chronic renal failure.  1. Scrotal abscess status post I and D: Today is postoperative day zero. Currently the patient is on ceftriaxone and Flagyl and vancomycin. I agree with this regimen. However, I would monitor vancomycin closely given the patient's acute on chronic renal failure.  2. Acute on chronic renal failure: Recommend normal saline hydration and basic metabolic panel assessment. If renal failure persists, we will consider obtaining a renal ultrasound and a nephrology consult. I expect this is most likely a result of urinary retention from significant scrotal edema. Please note that the renal ultrasound from 10/2011 was reviewed and at that time the patient had no hydronephrosis and the patient's kidneys appeared hyperechogenic consistent with the clinical history of diminished renal function.  3. Diabetes mellitus, uncontrolled: Reportedly, the patient had a hemoglobin A1c of 14. We will review outpatient records. In the interim we will manage his blood glucose with Lantus 5 units at bedtime and NovoLog sliding scale.  4. Transaminitis: The patient is noted to have transaminitis on labs today. The etiology of this is unclear. He denies any abdominal pain. At this point I think it would be appropriate to monitor his hepatic function and repeat in a few weeks. Further management can be deferred to the primary care physician given that patient is asymptomatic at this point.  5. CODE STATUS: The patient is a DO NOT RESUSCITATE, DO NOT INTUBATE. This was discussed extensively with the  patient. When I asked about a surrogate decision maker, he said he did not want anyone else to make any decision for him, and that if it was his time to go to let him go and not ask any of his family members about his care. He did list Lona Kettle, who is his niece, as an emergency contact. However, he clearly stated he did not want to be resuscitated nor intubated for any reason. 6. GI bleed:  Unclear if this is true gastrointestinal bleed or as a result of this large scrotal abscess. The patient will benefit from further outpatient evaluation of his possible rectal bleeding and anemia.  7. Microcytic anemia, chronic.  The patient's hemoglobin has ranged between truly about 11 and 13.6. Currently it is 10.6, which may be as a result of postoperative blood loss. At this point  monitor CBCs and further work-up in the outpatient setting.    TIME SPENT COORDINATING CONSULT:  40 minutes.     ____________________________ Aurther Loft, DO aeo:bjt D: 04/10/2012 19:08:22 ET T: 04/11/2012 10:34:32 ET JOB#: [id number]  cc: Aurther Loft, DO, <Dictator> Scott C. Lonna Cobb, MD Marisue Ivan, MD Nihar Klus E Gemini Bunte DO ELECTRONICALLY SIGNED 04/13/2012 1:22

## 2014-08-23 NOTE — Consult Note (Signed)
   Comments   I met with pt's sister and niece. Pt ready to be extubated. Will try to discuss goals of therapy with pt after extubation but he is unable to express himself, family does not want re-intubation.  extubated. Appears comfortable. Family at bedside.   Electronic Signatures: Ruhi Kopke, Izora Gala (MD)  (Signed 29-Aug-14 11:28)  Authored: Palliative Care   Last Updated: 29-Aug-14 11:28 by Baden Betsch, Izora Gala (MD)

## 2014-08-23 NOTE — Consult Note (Signed)
Brief Consult Note: Diagnosis: Altered mental status, Delirium resolving.   Patient was seen by consultant.   Consult note dictated.   Recommend further assessment or treatment.   Orders entered.   Comments: [name] apparently has no psychiatric past. He was confused, hallucinating, delirious. He is much better today. he is oriented and conversational.   PLAN: 1. Delirium is a medical emergency. please continue treat underlying causes.   2. Will continue low dose Risperdal for delirium.  3. I will follow along.  Electronic Signatures: Kristine LineaPucilowska, Dashon Mcintire (MD)  (Signed 19-Sep-14 13:10)  Authored: Brief Consult Note   Last Updated: 19-Sep-14 13:10 by Kristine LineaPucilowska, Wiktoria Hemrick (MD)

## 2014-08-23 NOTE — Consult Note (Signed)
PATIENT NAME:  [name] [name], [name] [name] MR#:  [id number] DATE OF BIRTH:  06-16-1960  DATE OF CONSULTATION:  08/23/2012  REFERRING PHYSICIAN:  Marisue Ivan, MD  CONSULTING PHYSICIAN:  Stann Mainland. Sampson Goon, MD  REASON FOR CONSULTATION: Osteomyelitis.   HISTORY OF PRESENT ILLNESS: This is a 54 year old gentleman with poorly-controlled diabetes, admitted April 17th with a pericardial effusion following a syncopal episode. He was found to have a creatinine of 9, and he had advanced end-stage renal disease. He was started on hemodialysis. 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 He had a white count of 18,000 at admission. He was initially treated with ceftriaxone and ciprofloxacin. Dr. Alberteen Spindle, in podiatry, performed  surgery on April 21st with debridement of the bone from the left 4th metatarsal. The patient has had cultures pending since then, but they are now growing enterococcus and are pending anaerobic culture results.  We are consulted for further antibiotic management.   The patient reports feeling relatively well today. He has had a hemodialysis PermCath placed.  He has been tolerating hemodialysis. He has had no fevers.  He has advanced peripheral neuropathy so has minimal sensation in the foot, too.   PAST MEDICAL HISTORY: 1.  Diabetes, poorly controlled.  2.  End-stage renal disease diagnosed this admission.  3.  Recent Fournier's gangrene and a scrotal abscess in December 2013.  4.  Left foot gangrene, status post distal transmetatarsal amputation in 2009 in [place].  5.  Anemia of chronic disease.   PAST SURGICAL HISTORY:  1.  Scrotal abscess operation December 2013.  2.  Distal transmetatarsal amputation 2009.    SOCIAL HISTORY: The patient lives with his girlfriend. He does not smoke. No history of alcohol and tobacco use. He is retired from work.   FAMILY HISTORY: His mother died at 47 of complications of a stroke. Father died of diabetic  complications at  45.  REVIEW OF SYSTEMS: Eleven systems reviewed and negative except as per HPI.   ALLERGIES: No known drug allergies.   MEDICATIONS: Since admission, the patient has been on ceftriaxone and ciprofloxacin renally dosed. Other medications include diltiazem, calcium carbonate, gabapentin, sodium bicarbonate, tamsulosin, glipizide 5 mg twice a day, sliding scale insulin, Epogen and Norco.   PHYSICAL EXAMINATION: VITAL SIGNS: T-max over the last 24 hours 98.5. He has been afebrile throughout his hospitalization. Pulse 106, blood pressure 130/68, respirations 20, sat 90% on room air.  GENERAL: He is pleasant, interactive with somewhat unusual affect.  HEENT: Pupils are equal, round and reactive to light and accommodation. Extraocular movements are intact. Sclerae are anicteric. Oropharynx is clear.   NECK: Supple.  HEART: Regular with no murmurs.  LUNGS: Clear to auscultation bilaterally. He has a Port-A-Cath in his left chest that is newly placed.  He also has a triple lumen catheter in his right neck.  ABDOMEN: Soft, nontender, nondistended. No hepatosplenomegaly.  EXTREMITIES: His left foot has a transmetatarsal amputation. Post surgically he has in the 4th  metatarsal and open debrided area that is quite deep and is packed. The packing is relatively dry and has no odor. There is some granulation tissue present. There is some mild edema and mild erythema but no induration.   LABORATORY AND RADIOLOGICAL DATA:  Culture results from his left metatarsal reveals heavy growth of enterococcus faecium,  and the culture has been held for possible anaerobes. Gram stain had shown rare gram-positive cocci and rare gram-positive rods.  The enterococcus was sensitive to ampicillin and linezolid.  He also had stool culture and C. diff that were negative and urine culture that was negative. His white blood count at admission on the 18th was markedly elevated at 18,000.  This has decreased today to 6.4. Hemoglobin is  7.7, platelets 544. The patient'sbasic panel reflects end-stage renal disease. Urinalysis was negative.   Imaging: X-ray of the chest done dated April 23rd revealed increase in density in the left lower lobe with possible pleural effusion.   IMPRESSION: The patient is a 54 year old gentleman with poorly-controlled diabetes, now end-stage renal disease, peripheral neuropathy, status post prior amputation of his left transmetatarsal area, now with an osteomyelitis of the 4th metatarsal. He has had surgery, and his white count has improved from 18,000 Cultures are growing enterococcus which is sensitive to ampicillin. He also has possibly grown an anaerobe, although those  cultures are pending. The wound appears relatively intact.   PLAN:   1.  I spoke with Dr. Alberteen Spindleline after the surgery. He feels that the remaining bone was intact and relatively healthy-appearing and that the infected bone was removed. Given this and the response he has had in terms of his white count, I would suggest we proceed with oral antibiotic therapy. I have stopped the ceftriaxone as this will not cover enterococcus. I placed him on Unasyn, and we can transition him orally to Augmentin at discharge. The dosing currently is 3 grams q. 24 with the dialysis. For Augmentin, he will be on 500 mg q. 24 with the dose given after hemodialysis. I would continue to ciprofloxacin for now until cultures are finalized as well.  2.  Plan on a 4 to 6-week course of this.  3.  I can see him in followup to help assist with further antibiotic management and to decide on the duration of therapy.  4.  Diabetes control will also be important.   5.  I discussed the plan with the patient and with Dr. Alberteen Spindleline.   Thank you for the consult.  I will be glad to follow with you.  ____________________________ Stann Mainlandavid P. Sampson GoonFitzgerald, MD dpf:cb D: 08/23/2012 21:31:15 ET T: 08/23/2012 22:50:28 ET JOB#: [id number]  cc: Stann Mainlandavid P. Sampson GoonFitzgerald, MD, <Dictator> Nilsa Macht  Sampson GoonFITZGERALD MD ELECTRONICALLY SIGNED 08/30/2012 12:09

## 2014-08-23 NOTE — Consult Note (Signed)
   Comments   I met with pt's sister, Claiborne Billings, who is here from [place]. Updated her on pt's current medical condition. Sister says that pt is estranged from his other siblings so Claiborne Billings has assumed the role of decision-maker. We discussed the options for pt post-extubation in the event he fails. I talked to her about trach/PEG/LTACH vs DNI status. Sister is undecided on what pt would want. Will consider options.  expressed appreciation for meeting. All questions answered.   Electronic Signatures: Nasira Janusz, Izora Gala (MD)  (Signed 25-Aug-14 12:04)  Authored: Palliative Care   Last Updated: 25-Aug-14 12:04 by Mishawn Didion, Izora Gala (MD)

## 2014-08-23 NOTE — Op Note (Signed)
PATIENT NAME:  [name] [name], [name] [name] MR#:  [id number] DATE OF BIRTH:  [birth year]  DATE OF PROCEDURE:  08/21/2012  SURGEON: Linus Galasodd Hercules Hasler, DPM  PREOPERATIVE DIAGNOSIS: Osteomyelitis, left fourth metatarsal.  POSTOPERATIVE DIAGNOSIS: Osteomyelitis, left fourth metatarsal.   PROCEDURE: Debridement bone, left fourth metatarsal.   ANESTHESIA: Local, MAC.   HEMOSTASIS: Pneumatic tourniquet left ankle 250 mmHg, 300 mmHg, 350 mmHg.   ESTIMATED BLOOD LOSS: 50 mL.   PATHOLOGY: Bone, left fourth metatarsal.   DRAINS: A  4x4 gauze packed within the wound.   COMPLICATIONS: Continued bleeding even with the tourniquet during the procedure.   OPERATIVE INDICATIONS: This is a 54 year old male recently admitted with kidney failure as well as a full-thickness ulceration beneath his left foot. X-rays showed destructive changes of the fourth metatarsal consistent with osteomyelitis. The patient was scheduled for debridement of the bone once he was stabilized from a medical standpoint.   OPERATIVE PROCEDURE: The patient was taken to the operating room and placed on the table in the supine position. Following satisfactory sedation, the left foot was anesthetized with 9 mL of 0.5% Sensorcaine plain around the fourth metatarsal area on the left foot. Pneumatic tourniquet applied at the level of the left ankle and the foot was prepped and draped in the usual sterile fashion. The foot was exsanguinated and the tourniquet inflated to 250 mmHg. Attention was then directed to the dorsal aspect of the left foot where an approximate 3 cm linear incision was made coursing proximal to distal over the fourth metatarsal area. Following the incision, there was significant bleeding. Attempted coagulation with cautery which was not that successful. The incision was carried down to the level of the bone where the distal fourth metatarsal was identified and freed from the surrounding anatomy. Using a pneumatic saw, the bone was incised and  the distal fragment was removed. Destructive changes were noted at the distal aspect of the fourth metatarsal. At this point, the foot was re-exsanguinated using an Esmarch. The tourniquet was deflated and then re-inflated at 300 mmHg. It was noted that the patient continued to bleed through the tourniquet at 300 mmHg. Next, Arista anticoagulant powder was then applied into the wound to attempt hemostasis, but he continued to bleed through. Pressure was applied to the dorsal incision and then the plantar wound was excisionally debrided at a full-thickness depth all the way down to the area of the removed bone on a setting of 7. Good healthy tissues were noted following debridement. A Ray-Tec was then packed into the plantar wound and there was continued bleeding. The tourniquet was then inflated up to 350 mmHg. Continued bleeding was noted at this time. The dorsal incision was then closed using 4-0 nylon simple interrupted sutures which did seem to control most of the bleeding from the dorsal incision. The packing on the plantar aspect was removed and the remainder of the Arista powder was then applied into the depths of the plantar wound. The tourniquet was completely released and the foot was elevationally exsanguinated. It did appear to significantly slow down the blood flow from the plantar wound. Next, a Xeroform was applied at the dorsal incision followed by a sterile saline gauze packed into the wound followed by 4x4s, Fluffs, two ABDs, Kerlix and an Ace bandage. The patient tolerated the procedure and anesthesia well and was then transported to the PACU in stable condition.     ____________________________ Linus Galasodd Jermiah Howton, DPM tc:es D: 08/21/2012 13:13:49 ET T: 08/21/2012 13:27:10 ET JOB#: [id number]  cc: Linus Galasodd Icy Fuhrmann,  DPM, <Dictator> Awais Cobarrubias DPM ELECTRONICALLY SIGNED 08/22/2012 15:12

## 2014-08-23 NOTE — Consult Note (Signed)
   Comments   I met with pt's sister/HCPOA, Malcolm Metro. She understands that pt is declining and likely unable to continue longterm outpt dialysis. We discussed stopping dialysis and transferring to [place]. Sister feels that this is the most appropriate option for pt at this time. I discussed with Dr Candiss Norse who agrees. Ginny Ward, RN, liason for the [facility] notified and will see pt. [facility]ospice Home orders entered.  Dx: ESRD     Electronic Signatures: Shalyn Koral, Izora Gala (MD)  (Signed 24-Sep-14 10:08)  Authored: Palliative Care   Last Updated: 24-Sep-14 10:08 by Jereline Ticer, Izora Gala (MD)

## 2014-08-23 NOTE — H&P (Signed)
PATIENT NAME:  [name] [name], [name] [name] MR#:  [id number] DATE OF BIRTH:  27-Mar-1961  DATE OF ADMISSION:  09/21/2012  PRIMARY CARE PHYSICIAN:  Annitta NeedsMarie Evangelista, PA-C   CHIEF COMPLAINT: Shortness of breath, hypoxia.   HISTORY OF PRESENT ILLNESS: [name] is a 54 year old Caucasian gentleman with past medical of end-stage renal disease on hemodialysis through PermCath that recently got placed, who received a left AV fistula placed on 09/20/2012. The patient was discharged today, which is 09/21/2012 in the morning hours.  He went for his routine dialysis. The patient could not be dialyzed because his PermCath for hemodialysis has been clotted. He started having some shortness of breath, came to the Emergency Room and was found to be hypoxic with saturation down in the 70s. He had to be placed temporarily on BiPAP, which helped his shortness of breath. Right now, during my evaluation, he is on 2 liters nasal cannula. Sats are around 96% to 98%. The patient is being admitted for further evaluation and management.   PAST MEDICAL HISTORY: 1.  History of osteomyelitis status post left fourth metatarsal debridement per Dr. Alberteen Spindleline.  2.  End-stage renal disease on hemodialysis through PermCath, which was  declotted last Wednesday due to malfunctioning and has been clotted again today.  3.  Placement of left AV fistula yesterday, 09/20/2012,  by Dr. Wyn Quakerew.  4.  Anemia of chronic disease.  5.  Type 2 diabetes with neuropathy.  6.  History of brief episode of atrial fibrillation with RVR status post surgery, which is resolved.   PROCEDURES:  1.  Left fourth metatarsal debridement.  2.  PermCath placement.  3.  Left AV fistula placement on 09/20/2012.   CURRENT MEDICATIONS: 1.  Glipizide 10 mg b.i.d.  2.  Gabapentin 300 mg b.i.d.   ALLERGIES: No known drug allergies.   SOCIAL HABITS:  Nonsmoker, nonalcoholic.   SOCIAL HISTORY: Lives with his girlfriend. Retired from work. 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     FAMILY HISTORY: From old records, mother died at 8272 from complications of stroke. Father died at age 56 from diabetic complications.   REVIEW OF SYSTEMS:  CONSTITUTIONAL: No fever. Positive for fatigue, weakness.  EYES: No blurred or double vision or glaucoma.  ENT: No tinnitus, ear pain, hearing loss.  RESPIRATORY: No cough. Positive for shortness of breath.  CARDIOVASCULAR: No chest pain. Positive for shortness of breath and hypertension.  GASTROINTESTINAL: No nausea, vomiting, diarrhea, abdominal pain or GERD.  GENITOURINARY: No dysuria or hematuria.  ENDOCRINE: No polyuria or nocturia.  HEMATOLOGY: Anemia of chronic disease.  SKIN: No acne or rash.  MUSCULOSKELETAL: Positive for arthritis.  NEUROLOGIC:  No CVA or transient ischemic attack.  PSYCHIATRIC: No anxiety or depression.  All other systems reviewed and negative.   PHYSICAL EXAMINATION: GENERAL: The patient is awake, alert, oriented x 3.  VITAL SIGNS:  Mild tachycardic with heart rate in the 110s and blood pressure is 107/52, sats are 96% on 2 liters.  HEENT: Atraumatic, normocephalic. Pupils are equal, round and reactive to light and accommodation. EOMI intact. Oral mucosa is moist.  NECK: Supple. No JVD. No carotid bruit.  LUNGS: Clear to auscultation bilaterally. No rales, rhonchi, respiratory distress or labored breathing. Breath sounds are decreased bibasilarly. I could not appreciate any rhonchi or crackles.  CARDIOVASCULAR: Both the heart sounds are normal, tachycardia present. No murmur heard. PMI not lateralized. Chest is nontender.   EXTREMITIES: Good pedal pulses, good femoral pulses. No  lower extremity edema. The patient has some chronic venous stasis changes.  ABDOMEN: Soft, benign, nontender. No organomegaly. Positive bowel sounds.  VASCULAR: The patient has PermCath placement on the left chest. Skin around the area looks normal.  NEUROLOGICAL:  Grossly intact  cranial nerves II through XII. No motor deficits. The patient has peripheral sensory neuropathy.  PSYCHIATRIC: The patient is awake, alert, oriented x 3. He is a little bit irritable evening in the Emergency Room.  SKIN: Warm and dry.   LABORATORY, DIAGNOSTIC AND RADIOLOGIC DATA: Chest x-ray shows a small bilateral pleural effusion, bilateral mild opacities likely secondary to atelectasis.   B-type natriuretic peptide is 32,000. White count is 27,000, hemoglobin and hematocrit is 8.3 and 27.7. Glucose is 142, BUN 53, creatinine 6.9, sodium 137, potassium is 4.2, chloride 102, bicarbonate 28, calcium 7. Troponin is 0.08. D-dimer is more than 6.   EKG shows sinus tachycardia, low-voltage QRS.   ASSESSMENT: [name] is a  54 year old  with history of end-stage renal disease on hemodialysis, history of type 2 diabetes, who comes in with:  1.  Acute hypoxic respiratory failure.  Suspected volume overload load from unable to get dialysis one today. His dialysis was on Tuesday. The patient was brought in with malfunctioning hemodialysis catheter along with sats down in the 70s. He was on BiPAP in the ER for about 15 to minutes to half an hour and felt better thereafter and now sats in the 90s with 2 liters nasal cannula oxygen. The patient is not in any respiratory distress. We will admit the patient to telemetry floor. Continue oxygen. We will also continue nebulizers around-the-clock.  2.  Malfunctioning hemodialysis Perm catheter.  Dr. Gilda Crease to see patient to see if it could be changed tonight so he can get hemodialysis to get some of this fluid off his lungs to help with his respiratory status.  3.  End-stage renal disease on hemodialysis. The case discussed with Dr. Thedore Mins, who will discuss with the vascular to see if the catheter can be changed tonight. If not, first thing in the morning to get the patient his dialysis on schedule.  4.  Leukocytosis. White count of 27,000. The patient does not  look toxic. He been in and out of the hospital; hence, will cover empirically with Zosyn and azithromycin;  however, this could be reactive due to respiratory distress. The patient currently is afebrile. We will continue to monitor blood cultures, white count and mild fever curve. If the patient remained afebrile and doing, then consider discontinuing antibiotics.  5.  Type 2 diabetes. On glipizide, which we will continue.  6.  Deep vein prophylaxis with heparin.   Further work-up according to the patient's clinical course.  Hospital admission plan was discussed and the  patient is agreeable to it.   TIME SPENT: 50 minutes.     ____________________________ Wylie Hail Allena Katz, MD sap:cc D: 09/21/2012 16:26:49 ET T: 09/21/2012 17:21:25 ET JOB#: [id number]  cc: Analucia Hush A. Allena Katz, MD, <Dictator> Mosetta Pigeon, MD Marisue Ivan, MD  Willow Ora MD ELECTRONICALLY SIGNED 09/28/2012 14:49

## 2014-08-23 NOTE — Op Note (Signed)
PATIENT NAME:  [name] [name], [name] [name] MR#:  [id number] DATE OF BIRTH:  10-Sep-1960  DATE OF PROCEDURE:  09/20/2012  PREOPERATIVE DIAGNOSES: 1.  End stage renal disease. 2.  Hypertension.   POSTOPERATIVE DIAGNOSES: 1.  End stage renal disease. 2.  Hypertension.  PROCEDURE:  Left radiocephalic AV fistula creation.  SURGEON:  Annice NeedyJason S. Dew, M.D.   ANESTHESIA:  MAC.   ESTIMATED BLOOD LOSS:  Minimal.   INDICATIONS FOR PROCEDURE:  A 54 year old white male who recently started on hemodialysis.  He is brought in for fistula placement.  He has a PermCath and his anatomy by ultrasound suggests adequate vessels for radiocephalic AV fistula creation.  Risks and benefits were discussed.  Informed consent was obtained.   DESCRIPTION OF PROCEDURE:  The patient is brought to the operative suite and after adequate level of intravenous sedation was obtained, the left upper extremity was sterilely prepped and draped and a sterile surgical field was created.  The area was anesthetized with 0.5% Marcaine.  An incision was created between the palpable radial pulse and the cephalic vein.  The cephalic vein was dissected out.  This was a small to medium-sized vessel that was patent and did appear as if it would be usable for dialysis.  Near the planned area of anastomosis there was a branch.  Both distal branches were ligated with silk ties, and divided and the vein was marked for orientation.  I used this branch area to make a wide fishmouth tenotomy to match the arteriotomy.  The radial artery was dissected out and encircled with vessel loops proximally and distally.  The patient was systemically heparinized and an anterior arteriotomy was created with an 11 blade and extended with Potts scissors.  The venotomy was created as above and anastomosis created with a running 6-0 Prolene suture.  The vessel was flushed and de-aired prior to releasing control.  A single 6-0 Prolene patch suture was used for hemostasis and hemostasis  was complete.  The wound was then irrigated.  Surgicel was placed.  It was closed with a running 3-0 Vicryl and 4-0 Monocryl.  Dermabond was placed as a dressing.  The patient tolerated the procedure well and was taken to the recovery room in stable condition.    ____________________________ Annice NeedyJason S. Dew, MD jsd:ea D: 09/20/2012 17:39:02 ET T: 09/20/2012 19:37:46 ET JOB#: [id number]  cc: Annice NeedyJason S. Dew, MD, <Dictator> Annice NeedyJASON S DEW MD ELECTRONICALLY SIGNED 09/27/2012 13:55

## 2014-08-23 NOTE — Consult Note (Signed)
   Comments   Follow up visit made. Sedation stopped, successful SBT with PEEP 5/PS 5 for 1 hr. Pt extubated. Lethargic but follows simple commands. Respiratory status stable. left message with niece Tia Amell to update her on pt's condition.   Electronic Signatures: Bayan Kushnir, Harriett SineNancy (MD)  (Signed 21-Aug-14 15:42)  Authored: Palliative Care   Last Updated: 21-Aug-14 15:42 by Valrie Jia, Harriett SineNancy (MD)

## 2014-08-23 NOTE — Consult Note (Signed)
PATIENT NAME:  [name] [name], [name] [name] MR#:  [id number] DATE OF BIRTH:  04/03/1961  DATE OF CONSULTATION:  08/18/2012  REFERRING PHYSICIAN:  Krystal EatonShayiq Ahmadzia, MD CONSULTING PHYSICIAN:  Lamar BlinksBruce J. Kowalski, MD  REASON FOR CONSULTATION: Pericardial effusion, diabetes, chronic kidney disease and syncope.   CHIEF COMPLAINT: "I have dizziness."   HISTORY OF PRESENT ILLNESS:  This is a 54 year old male with known diabetes with significant peripheral vascular disease and left foot amputation for which the patient has been on stable medication management and treatment with some lower extremity edema. The patient has had an episode of presyncope/syncope for which he has almost passed out and then somewhat dizzy off and on over the last several days. He has had elevated white count for which he is receiving appropriate medication management and treatment of infection for which he feels slightly better. He was somewhat malaised  in the last several weeks. The patient also has had a CT scan looking for further evaluation and has a small pleural effusion and a mild to moderate pericardial effusion for which an echocardiogram was performed showing a moderate pericardial effusion and no current evidence of LV dysfunction and/or significant valvular heart disease causing this issue. The patient has had no evidence of tamponade.  The patient's EKG shows normal sinus rhythm with right atrial enlargement with low voltage, but no evidence of changes.  He does have normal troponin and CK-MB. He does have chronic kidney disease, which is stable at this time. The patient has had no further evidence of significant syncopal episodes since admission.   REVIEW OF SYSTEMS: The remainder is negative for vision change, ringing in the ears, hearing loss, cough, congestion, heartburn, nausea, vomiting, diarrhea, bloody stools, stomach pain, extremity pain, leg weakness, cramping of the buttocks, known blood clots, headaches, blackouts, dizzy  spells, nosebleeds, congestion, trouble swallowing, frequent urination, urination at night, muscle weakness, numbness, anxiety, depression, skin lesions or skin rashes.   PAST MEDICAL HISTORY: 1.  Chronic kidney disease.  2.  Diabetes.  3.  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  4.  Abnormal EKG.   FAMILY HISTORY: No family members with early onset of cardiovascular disease or hypertension.   SOCIAL HISTORY: Currently denies alcohol or tobacco use.   ALLERGIES: No known drug allergies.   CURRENT MEDICATIONS: As listed.   PHYSICAL EXAMINATION: VITAL SIGNS: Blood pressure is 110/68 bilaterally and heart rate is 72 upright and reclining, and regular.  GENERAL: He is a well appearing male in no acute distress.  HEENT: No icterus, thyromegaly, ulcers, hemorrhage or xanthelasma.  HEART:  Regular rate and rhythm with normal S1 and S2 with no murmur, gallop or rub. PMI is normal size and placement. Carotid upstroke is normal without bruit. Jugular venous pressure is normal.  LUNGS:  A few basilar crackles with normal respirations.  ABDOMEN: Soft and nontender without hepatosplenomegaly or masses. Abdominal aorta is normal size without bruit.  EXTREMITIES: 2+ radial, femoral, dorsal and pedal pulses with 1+ lower extremity edema. No cyanosis, clubbing or ulcers. There is left foot amputation.  NEUROLOGIC: He is oriented to time, place and person with normal mood and affect.   ASSESSMENT: This is a  54 year old male with acute inflammatory abnormality with chronic kidney disease, hypertension, hyperlipidemia, diabetes and pericardial effusion without evidence of tamponade and normal left ventricle systolic function needing further treatment options.   RECOMMENDATIONS: 1.  No further cardiac intervention at this time due to no evidence of tamponade or other significant issues today.  2.  Continue diabetic medication management for goal hemoglobin A1c below 7.  3.  Possible  diuretic therapy for lower extremity edema and chronic kidney disease as able.   4.  No further intervention at this time of pericardial effusion due to no evidence of tamponade symptoms and/or other EKG changes, but would watch for other further significant concerns as well as further treatment of inflammatory changes and/or infection which is likely related to issues.  5.  Further ambulation and follow for any further symptoms that may require further additional medication management.  ____________________________ Lamar Blinks, MD bjk:sb D: 08/18/2012 10:56:50 ET T: 08/18/2012 11:09:25 ET JOB#: [id number]  cc: Lamar Blinks, MD, <Dictator> Lamar Blinks MD ELECTRONICALLY SIGNED 08/23/2012 8:13

## 2014-08-23 NOTE — Op Note (Signed)
PATIENT NAME:  [name] [name], Irl MR#:  [id number] DATE OF BIRTH:  Sep 19, 1960  DATE OF PROCEDURE:  08/23/2012  PREOPERATIVE DIAGNOSES:  1. End-stage renal disease.  2. Diabetes.  POSTOPERATIVE DIAGNOSES: 1. End-stage renal disease.  2. Diabetes.  PROCEDURE: 1. Ultrasound guidance for vascular access, left jugular vein.  2. Fluoroscopic guidance for placement of catheter.  3. Placement of a 23 cm tip-to-cuff tunneled hemodialysis catheter.   SURGEON: Annice NeedyJason S. Dew, MD  ANESTHESIA: Local with moderate conscious sedation.   ESTIMATED BLOOD LOSS: Approximately 50 mL.   FLUOROSCOPY TIME: Approximately 1 minute.   INDICATION FOR PROCEDURE: A 54 year old male with chronic kidney disease which has now progressed to end-stage renal failure. We are asked to place a Perma-Cath, and he will begin outpatient dialysis.   DESCRIPTION OF PROCEDURE: The patient is brought to the vascular and interventional radiology suite. Neck and chest were sterilely prepped and draped, and a sterile surgical field was created. The left jugular vein was visualized on ultrasound and found to be widely patent. It was then accessed under direct ultrasound guidance without difficulty with the Seldinger needle. A J-wire was placed. After skin nick and dilatation, the peel-away sheath was placed over the wire, and the wire and dilator were removed. I then anesthetized an area 2 fingerbreadths below the left clavicle and tunneled from the subclavicular incision to the access site. Using fluoroscopic guidance, I selected a 23 cm tip-to-cuff tunneled hemodialysis catheter. This was then tunneled, placed through the peel-away sheath and the peel-away sheath was removed. The catheter tip was parked just into the right atrium. The appropriate distal connectors placed. It withdrew blood well and flushed easily with heparinized saline, and the concentrated heparin solution was placed. It was secured to the chest wall with 2 silk sutures. A  4-0 Monocryl pursestring suture was placed around the catheter exit site and a 4-0 Monocryl suture was used to close the access site. Sterile dressing was placed. The patient tolerated the procedure well and was taken to the recovery room in stable condition.     ____________________________ Annice NeedyJason S. Dew, MD jsd:OSi D: 08/23/2012 12:31:38 ET T: 08/23/2012 12:48:49 ET JOB#: [id number]  cc: Annice NeedyJason S. Dew, MD, <Dictator> Annice NeedyJASON S DEW MD ELECTRONICALLY SIGNED 08/28/2012 9:29

## 2014-08-23 NOTE — Discharge Summary (Signed)
PATIENT NAME:  [name], [name] MR#:  [id number] DATE OF BIRTH:  October 12, 1962Buzzy Han  DATE OF ADMISSION:  09/21/2012 DATE OF DISCHARGE:  09/22/2012  DISCHARGE DIAGNOSES:  1. Acute respiratory failure.  2. Malfunctioning PermCath.  3. End-stage renal disease, on hemodialysis.  4. Leukocytosis.  5. Adult onset diabetes.   DISCHARGE MEDICATIONS:  1. Gabapentin 300 mg p.o. b.i.d.  2. Glipizide 10 mg p.o. b.i.d.  3. Acetaminophen 325 two tabs p.o. q.4 hours as needed for pain and fever.  4. Senna as directed.  5. Docusate as directed.   6. Calcium acetate as directed.   CONSULTS: Vascular surgery, nephrology.   PROCEDURE: The patient had a revision of the PermCath.   PERTINENT LABS: On the day of discharge, sodium 139, potassium 4, creatinine 7.63, glucose 44. White blood cell count 9.4, hemoglobin 7.4 and platelets 419. Chest x-ray did show bilateral pleural effusions that were small. Basilar opacities with atelectasis.   BRIEF HOSPITAL COURSE:  1. Acute respiratory failure: The patient initially came in with acute respiratory failure secondary to pleural effusions and opacities likely consistent with atelectasis. Did have a history of systolic congestive heart failure, but there seemed to be very low edema on the chest x-ray but still thought that this was likely fluid overload due to missing hemodialysis. He was given dialysis, and his respiratory status did improve.  2. Malfunctioning PermCath: The patient was seen by vascular surgery. Did have the PermCath replaced without complications.  3. End-stage renal disease: He had missed his hemodialysis as an outpatient and was given a PermCath revision performed and will resume hemodialysis.  4. Leukocytosis that did resolve. Initial white blood cell count was 27.6. Did come down to 9.4 prior to discharge. It was unsure what caused that. He did get cultures performed which were negative upon discharge.  5. Adult onset diabetes: He was hypoglycemic,  likely due to being noncompliant as an outpatient and then being treated as an inpatient.   DISPOSITION: The patient insisted on being discharged to home on the 23rd. Therefore, he was discharged. He will need followup with his primary, Dr. Burnadette PopLinthavong, within 10 days of discharge.   ____________________________ Marisue IvanKanhka Margurete Guaman, MD kl:gb D: 09/27/2012 16:38:22 ET T: 09/28/2012 02:48:25 ET JOB#: [id number]  cc: Marisue IvanKanhka Tamario Heal, MD, <Dictator> Marisue IvanKANHKA Halim Surrette MD ELECTRONICALLY SIGNED 10/02/2012 11:17

## 2014-08-23 NOTE — Discharge Summary (Signed)
PATIENT NAME:  [name] [name], [name] [name] MR#:  [id number] DATE OF BIRTH:  [birth year]  DATE OF ADMISSION:  08/17/2012 DATE OF DISCHARGE: 08/25/2012   DISCHARGE DIAGNOSES:  1. Osteomyelitis, status post left fourth metatarsal debridement per Dr. Alberteen Spindle.  2. End-stage renal disease, on hemodialysis, status post PermCath placement per Dr. Wyn Quaker.  3. Moderate pericardial effusion with new mild systolic congestive heart failure, ejection fraction of 45%.  4. Anemia of chronic disease.  5. Brief episode of atrial fibrillation with rapid ventricular response status post surgery that has resolved.  6. Uncontrolled diabetes with neuropathy.   DISCHARGE MEDICATIONS:  1. Gabapentin 300 mg p.o. b.i.d.  2. Glipizide 10 mg p.o. b.i.d.  3. Furosemide 20 mg p.o. daily as needed for edema.  4. Sodium bicarbonate 1300 mg p.o. b.i.d.  5. Calcium carbonate 500 mg tabs 2 tabs p.o. t.i.d.  6. Diltiazem 60 mg p.o. q.6 hours.  7. Augmentin 500/125 mg 1 tab p.o. daily for 4 weeks.   CONSULTANTS: Nephrology, podiatry, cardiology, vascular surgery and infectious disease.   PROCEDURES:  1. Left fourth metatarsal debridement.  2. PermCath placement.   PERTINENT LABS AND STUDIES: On day of discharge, white blood cell count 8.6, hemoglobin 7.2, platelets of 446. Sodium 139, potassium 4, creatinine 3.86, glucose 138. A1c of 9%. Last calcium was noted to be 6.8.   BRIEF HOSPITAL COURSE:  1. Osteomyelitis. The patient was noted to have purulent osteomyelitis, evaluated by Dr. Alberteen Spindle, who took him to surgery. Had a left fourth metatarsal debridement and was placed on Cipro and ceftriaxone initially until cultures revealed Enterococcus sensitive to ampicillin. Infectious diseases was consulted per Dr. Sampson Goon, who recommended 4 weeks of Augmentin. He will continue on that as an oral medication and follow up with Dr. Sampson Goon as an outpatient and follow up with Dr. Alberteen Spindle for foot wound instructions.  2. Atrial fibrillation with  rapid ventricular response. The patient had an acute episode of atrial fibrillation with rapid ventricular response status post surgery, likely due to the stress of the surgery. He did convert back to normal sinus rhythm. He was placed on diltiazem and will continue on that per recommendations per Dr. Gwen Pounds. We may consider taking him off of that as an outpatient if he remains in sinus rhythm and is rate controlled.  3. End-stage renal disease, on hemodialysis. The patient was evaluated by Dr. Thedore Mins and Dr. Cherylann Ratel with nephrology. He did undergo dialysis and had a PermCath placement per Dr. Wyn Quaker while he was here. Plan for continued dialysis as an outpatient. His next scheduled dialysis will be today and then again on Tuesday as an outpatient. Will need to follow up with Dr. Thedore Mins accordingly.  4. Anemia of chronic disease, likely due to his underlying renal failure. He is getting EPO per nephrology. Will continue to follow up as an outpatient. No transfusion needed as the patient was asymptomatic from that.  5. Adult-onset diabetes with neuropathy. Will continue with his home regimens. A1c was noted to be 9%. He has a history of poor compliance. This will be a challenge to keep this under control.   DISPOSITION: He is in fair condition. He is eager to be discharged to home. Will discharge home with appropriate followups with Dr. Thedore Mins, Dr. Wyn Quaker, Dr. Alberteen Spindle, Dr. Burnadette Pop and Dr. Sampson Goon.  TIME SPENT: It did take more than 30 minutes to complete discharge summary.  The patient is going to be discharged after dialysis today.   ____________________________ Marisue Ivan, MD kl:OSi D: 08/25/2012 08:25:50 ET  T: 08/25/2012 09:15:38 ET JOB#: [id number]  cc: Marisue IvanKanhka Annalie Wenner, MD, <Dictator> Mosetta PigeonHarmeet Singh, MD Annice NeedyJason S. Dew, MD Linus Galasodd Cline, DPM Stann Mainlandavid P. Sampson GoonFitzgerald, MD Marisue IvanKANHKA Rito Lecomte MD ELECTRONICALLY SIGNED 08/28/2012 8:24

## 2014-08-23 NOTE — Consult Note (Signed)
   Comments   I met with pt's sister, niece and nephew. Updated them on pt's current medical condition. They confirm that family has decided not to reintubate pt if he fails extubation. However, they remain hopeful that pt will be successfully extubate. Will continue vent wean.   Electronic Signatures: Malin Sambrano, Izora Gala (MD)  (Signed 28-Aug-14 13:39)  Authored: Palliative Care   Last Updated: 28-Aug-14 13:39 by Marcelle Hepner, Izora Gala (MD)

## 2014-08-23 NOTE — Consult Note (Signed)
PATIENT NAME:  [name] [name], Bryler MR#:  [id number] DATE OF BIRTH:  [birth year]  DATE OF CONSULTATION:  08/18/2012  CONSULTING PHYSICIAN:  Linus Galasodd Sharion Grieves, DPM  REASON FOR CONSULTATION:  This is a 54 year old male with a history of diabetes and neuropathy who has an ulcer on the bottom of his left foot. The patient does relate having his forefoot amputated back in 2009. Does not specifically relate any injury to his left foot. Has had some drainage. The patient was admitted to the hospital with renal failure and an episode of syncope and loss of consciousness yesterday. On admission was noted to be in renal failure with a creatinine of 9 and a white count elevated at 18,000.   PAST MEDICAL HISTORY: Diabetes with associated neuropathy, chronic kidney disease stage III, anemia of chronic diseases.   PAST SURGICAL HISTORY: Transmetatarsal amputation left foot 2009.   MEDICATIONS:  Sodium bicarbonate 300 mg twice a day, glipizide 10 mg twice daily, gabapentin 300 mg twice a day, furosemide 20 mg a day, amlodipine 2.5 mg daily.   ALLERGIES:  No known drug allergies.   SOCIAL HISTORY: He is retired. Lives with his girlfriend. Does not relate any alcohol or tobacco use.   FAMILY HISTORY:  Significant for stroke, diabetes.   REVIEW OF SYSTEMS: Does relate some swelling in his legs. Significant neuropathy in his feet, claims it is mostly the left. Does have a draining sore beneath his left forefoot. Denies any fever or chills. No stomach pain, heartburn or nausea. Denies any cough or shortness of breath. Does have a rash on his right lower extremity.   PHYSICAL EXAMINATION: VASCULAR: DP and PT pulses are palpable bilateral. Capillary filling time is intact to the toes on the right foot.  NEUROLOGICAL:  Complete loss of protective threshold with a monofilament wire bilateral. Proprioception is impaired on the right.  INTEGUMENT: Skin is warm, dry and supple. There is some bilateral edema. Some diminished hair  growth, but still present. A full-thickness ulceration on the plantar aspect of the left foot beneath the 4th or 5th metatarsal area. It does probe directly down to bone through the plantar wound. There is some devitalized and necrotic tissue in the depths of the wound. The central portion is approximately 1 cm diameter with a larger more superficial area approximately 2 cm diameter.  MUSCULOSKELETAL: Previous amputation left forefoot. Adequate range of motion.   LABORATORY DATA: Admission:  White blood cells were 18,500. Follow up today showed reduction down to 16,200. There is a significant left shift.   ASSESSMENT: 1.  Diabetes with associated neuropathy.  2.  Full thickness ulceration left forefoot with osteomyelitis.   PLAN:  We will obtain x-rays for evaluation. Discussed with the patient that he will most likely need debridement while he is in the hospital for removal of the infected bone and cleaning of the ulcer. The patient is very hesitant to have that done right now, but I discussed with him that we really need to get this taken care of. He will need IV antibiotics long term. We will obtain the x-rays and plan on surgery in the next day or two.     ____________________________ Linus Galasodd Candas Deemer, DPM tc:ce D: 08/18/2012 21:52:58 ET T: 08/19/2012 11:18:33 ET JOB#: [id number]  cc: Linus Galasodd Torre [place]umb[place], DPM, <Dictator> Osmara Drummonds DPM ELECTRONICALLY SIGNED 08/22/2012 15:11

## 2014-08-23 NOTE — Consult Note (Signed)
Brief Consult Note: Diagnosis: Altered mental status.   Patient was seen by consultant.   Comments: [name] [name] apparently has no psychiatric past. He is unable to participate in psychiatric interview. He is only able to say his name. He answers yes to all other questions.   PLAN: 1. There are no recommendations for treatment at this point.  2. If there is a question of capacity, the patient does not have the capacity to make medical decisions.  3. I will follow along.  Electronic Signatures: [name] [name], [name] [name] (MD)  (Signed 04-Sep-14 17:51)  Authored: Brief Consult Note   Last Updated: 04-Sep-14 17:51 by [name] [name], [name] [name] (MD)

## 2014-08-23 NOTE — Consult Note (Signed)
CHIEF COMPLAINT and HISTORY:  Subjective/Chief Complaint Consulted for temporary dialysis catheter placement   History of Present Illness 54 year old white male admitted with ARF/CKD III and pericardial effusion. Due to this, in addition to metabolic acidosis nephrology is planning for hemodialysis starting today, and patient therefore needs temporary dialysis catheter placed. Cardiology following as well. Patient has not been on dialysis in the past and has had no prior dialysis access.   PAST MEDICAL/SURGICAL HISTORY:  Past Medical History:   Scrotal abscess:    Diabetes Mellitus, Type II (NIDD):    amputation of toes:   ALLERGIES:  Allergies:  No Known Allergies:   HOME MEDICATIONS:  Home Medications: Medication Instructions Status  furosemide 20 mg oral tablet 1 tab(s) orally once a day, As Needed, edema Active  amlodipine 2.5 mg oral tablet 1 tab(s) orally once a day Active  gabapentin 300 mg oral capsule 1 cap(s) orally 2 times a day Active  glipiZIDE 10 mg oral tablet tab(s) orally 2 times a day Active  sodium bicarbonate 1300 milligram(s) orally 2 times a day Active   Family and Social History:  Family History Diabetes Mellitus  Stroke   Social History negative tobacco, negative ETOH, negative Illicit drugs   Place of Living Home   Review of Systems:  Fever/Chills chills   Cough Yes   Sputum No   Abdominal Pain No   Diarrhea No   Constipation No   Nausea/Vomiting No   Chest Pain No   Dysuria No   ROS syncope, dizzy   Physical Exam:  GEN no acute distress   HEENT PERRL, moist oral mucosa   NECK supple  No masses   RESP normal resp effort  clear BS   CARD regular rate  no murmur   ABD denies tenderness  soft  normal BS   LYMPH negative neck   EXTR negative cyanosis/clubbing, mild edema   SKIN normal to palpation   NEURO cranial nerves intact   PSYCH alert, A+O to time, place, person   LABS:  Laboratory Results: LabObservation:     18-Apr-14 07:37, Echo Doppler  OBSERVATION   Reason for Test  Cardiology:  Echo Doppler   REASON FOR EXAM:      COMMENTS:       PROCEDURE: ECH - ECHO DOPPLER COMPLETE(TRANSTHOR)  - Aug 18 2012  7:37AM     RESULT: Echocardiogram Report    Patient Name:   [name] [name] Date of Exam: 08/18/2012  Medical Rec #:  [id number]          Custom1:  Date of Birth:  08/15/1960        Height:       68.0 in  Patient Age:    54 years        Weight:       214.0 lb  Patient Gender: M               BSA:          2.10 m??    Indications: Pericardial Effusion  Sonographer:    JERRY HEGE RDCS  Referring Phys: LINTHAVONG, KANHKA    Sonographer Comments: Technically difficult study_ pt requested the echo   be stopped.    Summary:   1. Left ventricular ejection fraction, by visual estimation, is 45 to   50%.   2. Moderate pericardial effusion, as described above.   3. The pericardial effusion is posterior and lateral to the left   ventricle.   4. Mild   mitral valve regurgitation.   5. Mildly increased left ventricular posterior wall thickness.  2D AND M-MODE MEASUREMENTS (normal ranges within parentheses):  Left Ventricle:          Normal  IVSd (2D):      1.64 cm (0.7-1.1)  LVPWd (2D):     1.27 cm (0.7-1.1) Aorta/LA:                  Normal  LVIDd (2D):     4.38 cm (3.4-5.7) Aortic Root (2D): 3.00 cm (2.4-3.7)  LVIDs (2D):     3.15 cm           Left Atrium (2D): 3.20 cm (1.9-4.0)  LV FS (2D):     28.1 %   (>25%)  LV EF (2D):     54.6 %   (>50%)                                    Right Ventricle:                                    RVd (2D):        2.78 cm  LV DIASTOLIC FUNCTION:  MV Peak E: 0.79 m/s Decel Time: 174 msec  MV Peak A: 0.97 m/s  E/A Ratio: 0.81  SPECTRAL DOPPLER ANALYSIS (where applicable):  Mitral Valve:  MV P1/2 Time: 50.46 msec  MV Area, PHT: 4.36 cm??  LVOT Vmax:  LVOT VTI:  LVOT Diameter: 2.20 cm  Pulmonic Valve:  PV Max Velocity:0.92 m/s PV Max PG: 3.4 mmHg PV Mean  PG:    PHYSICIAN INTERPRETATION:  Left Ventricle: The left ventricular internal cavity size was normal. LV   posterior wall thickness was mildly increased. Left ventricular ejection   fraction, by visual estimation, is 45 to 50%.  Right Ventricle: Normal right ventricular size, wall thickness, and   systolic function. RV wall thickness is normal.  Left Atrium: The left atrium is normal in size and structure.  Right Atrium: The right atrium is normal in size and structure.  Pericardium: A moderately sized pericardial effusion is present. The   pericardial effusion is posterior and lateral to the left ventricle.     There is no evidence of cardiac tamponade.  Mitral Valve: Mild mitral valve regurgitation is seen.  Tricuspid Valve: Structurally normal tricuspid valve, with normal leaflet   excursion. The tricuspid valve is normal. Trivial tricuspid regurgitation   is visualized.  Aortic Valve: The aortic valve is trileaflet and structurally normal,   withnormal leaflet excursion; without any evidence of aortic stenosis or   insufficiency.  Pulmonic Valve: Structurally normal pulmonic valve, with normal leaflet   excursion.  Aorta: The aortic root and ascending aorta are structurally normal, with   noevidence of dilitation.  Shunts: There is no evidence of a patent foramen ovale. There is no   evidence of an atrial septal defect.  [id number] Bruce Kowalski MD  Electronically signed by [id number] Bruce Kowalski MD  Signature Date/Time: 08/18/2012/10:04:18 AM    *** Final ***    IMPRESSION: .        Verified By: BRUCE J. KOWALSKI  ([facility]), M.D., MD  Routine Chem:    18-Apr-14 03:54, Basic Metabolic Panel (w/Total Calcium)  Glucose, Serum 92  BUN 99  Creatinine (comp) 8.73  Sodium, Serum 138  Potassium,   Serum 4.4  Chloride, Serum 105  CO2, Serum 19  Calcium (Total), Serum 5.3  Anion Gap 14  Osmolality (calc) 306  eGFR (African American) 7  eGFR (Non-African American) 6  eGFR  values <60mL/min/1.73 m2 may be an indication of chronic  kidney disease (CKD).  Calculated eGFR is useful in patients with stable renal function.  The eGFR calculation will not be reliable in acutely ill patients  when serum creatinine is changing rapidly. It is not useful in   patients on dialysis. The eGFR calculation may not be applicable  to patients at the low and high extremes of body sizes, pregnant  women, and vegetarians.  Result Comment   CALCIUM - NOTIFIED OF CRITICAL VALUE   - RESULTS VERIFIED BY REPEAT TESTING.   - CALLED TO DEBORAH GLADDING:08/18/12 AT   - 0550...TPL   - READ-BACK PROCESS PERFORMED.   Result(s) reported on 18 Aug 2012 at 05:58AM.    18-Apr-14 19:30, Phosphorus, Serum  Phosphorus, Serum 7.1  Result(s) reported on 18 Aug 2012 at 08:24PM.  Routine Hem:    18-Apr-14 03:54, CBC Profile  WBC (CBC) 16.2  RBC (CBC) 3.71  Hemoglobin (CBC) 8.3  Hematocrit (CBC) 27.2  Platelet Count (CBC) 561  MCV 73  MCH 22.4  MCHC 30.6  RDW 17.7  Neutrophil % 83.6  Lymphocyte % 7.6  Monocyte % 5.6  Eosinophil % 2.6  Basophil % 0.6  Neutrophil # 13.5  Lymphocyte # 1.2  Monocyte # 0.9  Eosinophil # 0.4  Basophil # 0.1  Result(s) reported on 18 Aug 2012 at 05:58AM.   RADIOLOGY:  Radiology Results: XRay:    18-Apr-14 18:53, Chest Portable Single View  Chest Portable Single View  REASON FOR EXAM:    line placement  COMMENTS:       PROCEDURE: DXR - DXR PORTABLE CHEST SINGLE VIEW  - Aug 18 2012  6:53PM     RESULT: Comparison is made to the study of 08/17/2012. Cardiac silhouette   remains enlarged. A right jugular stool lumen central venous catheter has   been placed. The distal portion of the catheter is in the right atrium.   There is left lung base atelectasis/infiltrate/effusion. Cardiac   monitoring electrodes are present. There is no pneumothorax. The bony   structuresappear intact.    IMPRESSION:   1. Interval placement of a right-sided central venous  catheter via a   jugular approach with the distal tip of the catheter in the right atrium.  2. Stable cardiomegaly.  3. Poor a left lung base likely secondary to atelectasis, infiltrate or   underlying pleural effusion.    Dictation Site: 6        Verified By: GEOFFREY H. BROWNE, M.D., MD  LabUnknown:    17-Apr-14 21:42, CT Abdomen and Pelvis Without Contrast  PACS Image    18-Apr-14 18:53, Chest Portable Single View  PACS Image  CT:    17-Apr-14 21:42, CT Abdomen and Pelvis Without Contrast  CT Abdomen and Pelvis Without Contrast  REASON FOR EXAM:    (1) hx of fournier's gangrene, left sided swelling of   scrotum; (2) hx of fournie  COMMENTS:       PROCEDURE: CT  - CT ABDOMEN AND PELVIS W0  - Aug 17 2012  9:42PM     RESULT: Comparison: None    Technique: Multiple axial images from the lung bases to the symphysis   pubis were obtained without oral and without intravenous contrast.    Findings:    Mild basilar opacities are likely secondary to atelectasis. There is a   small left pleural effusion. There is a moderate to large pericardial   effusion. Some of the fluid demonstrates density greater than water,     which may be secondary to proteinaceous material or blood products.    Lack of intravenous contrast limits evaluation of the solid abdominal   organs.  Grossly, theliver, spleen, adrenals, and pancreas are   unremarkable. There is a calcified stone in the gallbladder. There is   mild patient motion artifact.    No renal calculi or hydronephrosis. No ureterectasis or ureteral calculi.    The prostate is enlarged. The bladder is moderately distended with fluid.   The small and large bowel are normal in caliber. The appendix is at the   upper limit of normal in diameter. There are no adjacent inflammatory   changes. There is a large left hydrocele. There are multiple mildly   enlarged left inguinal lymph nodes. There is a mildly enlarged left   external iliac chain  lymph node which measures 2.3 x 1.8 cm. Lobular mass     in the left groin likely represents an enlarged lymph node with a fatty   hilum. It measures 3.9 x 1.6 cm. There are a few borderline enlarged   right inguinal lymph nodes. No soft tissue gas seen in the peritoneum or   groin.    No aggressive lytic or sclerotic osseous lesions are identified.    IMPRESSION:   1. The bladder is moderately distended with fluid. The prostate is   enlarged.  2. Large left hydrocele.  3. Multiple enlarged left inguinal and external iliac chain lymph nodes.   These are nonspecific.  4. Moderate to large pericardial effusion. The density of some of the   pericardial fluid is slightly greater than water, suggesting     proteinaceous material or blood products.      Dictation Site: 8        Verified By: Gregor Hams, M.D., MD   ASSESSMENT AND PLAN:  Assessment/Admission Diagnosis 54 year old white male admitted with ARF/CKD III and pericardial effusion. Due to this, in addition to metabolic acidosis nephrology is planning for hemodialysis starting today, and patient therefore needs temporary dialysis catheter placed. Cardiology following as well. Patient has not been on dialysis in the past and has had no prior dialysis access. Patient was seen with Dr. Delana Meyer. Patient insisted that catheter must be in his neck, not his groin. Discussed this may cause scarring and make future dialysis access more difficult; he verbalized understanding. Dr. Delana Meyer placed right IJ temporary dialysis catheter today.   Electronic Signatures: Ankush Gintz, Magazine features editor (PA-C)  (Signed 18-Apr-14 22:38)  Authored: Chief Complaint and History, PAST MEDICAL/SURGICAL HISTORY, ALLERGIES, HOME MEDICATIONS, Family and Social History, Review of Systems, Physical Exam, LABS, RADIOLOGY, Assessment and Plan   Last Updated: 18-Apr-14 22:38 by Su Grand (PA-C)

## 2014-08-23 NOTE — Consult Note (Signed)
General Aspect complication of dialysis device   Present Illness The patient is a 54 year old gentleman with past medical of end-stage renal disease recently started on hemodialysis via a  PermCath.  Today the patient could not be dialyzed because his PermCath was clotted. He started having some shortness of breath, came to the Emergency Room and was found to be hypoxic with saturation down in the 70s. He had to be placed temporarily on BiPAP, which helped his shortness of breath. Right now, during my evaluation, he is on 2 liters nasal cannula. Sats are around 96% to 98%. The patient is being admitted for further evaluation and management.   PAST MEDICAL HISTORY: 1.  History of osteomyelitis status post left fourth metatarsal debridement per Dr. Cleda Mccreedy.  2.  End-stage renal disease on hemodialysis through PermCath, which was  declotted last Wednesday due to malfunctioning and has been clotted again today.  3.  Placement of left AV fistula yesterday, 09/20/2012  4.  Anemia of chronic disease.  5.  Type 2 diabetes with neuropathy.   Home Medications: Medication Instructions Status  gabapentin 300 mg oral capsule 1 cap(s) orally 2 times a day Active  glipiZIDE 10 mg oral tablet 1 tab(s) orally 2 times a day Active    No Known Allergies:   Case History:  Family History Non-Contributory   Social History negative tobacco, negative ETOH, negative Illicit drugs   Review of Systems:  Fever/Chills No   Cough No   Sputum No   Abdominal Pain No   Diarrhea No   Constipation No   Nausea/Vomiting No   SOB/DOE Yes   Chest Pain No   Telemetry Reviewed NSR   Physical Exam:  GEN well developed, well nourished   HEENT hearing intact to voice, dry oral mucosa   NECK supple  trachea midline   RESP normal resp effort  no use of accessory muscles   CARD regular rate  LE edema present  positive JVD   VASCULAR ACCESS AV fistula present  Dialysis catheter present  -- Purulent  drainage   ABD denies tenderness  soft  obese   EXTR positive edema, left foot with healed TMA   SKIN positive rashes, No ulcers   NEURO cranial nerves intact, motor/sensory function intact   PSYCH alert, A+O to time, place, person   Nursing/Ancillary Notes: **Vital Signs.:   22-May-14 18:21  Vital Signs Type Admission  Temperature Temperature (F) 97.7  Celsius 36.5  Temperature Source oral  Pulse Pulse 101  Respirations Respirations 20  Systolic BP Systolic BP 035  Diastolic BP (mmHg) Diastolic BP (mmHg) 73  Mean BP 88  Pulse Ox % Pulse Ox % 95  Pulse Ox Activity Level  At rest  Oxygen Delivery Room Air/ 21 %   Hepatic:  22-May-14 13:06   Bilirubin, Total 0.2  Alkaline Phosphatase  170  SGPT (ALT) 38  SGOT (AST)  47  Total Protein, Serum 8.0  Albumin, Serum  2.3  Routine Chem:  22-May-14 13:06   Result Comment D-Dimer - REPEATED AND CONFIRMED BY DILUTION  Result(s) reported on 21 Sep 2012 at 02:07PM.  Result Comment BNP - REPEATED AND CONFIRMED BY DILUTION  Result(s) reported on 21 Sep 2012 at 02:01PM.  Result Comment CALCIUM - RESULTS VERIFIED BY REPEAT TESTING.  - C/DEBBIE [email or web address]  - NOTIFIED OF CRITICAL VALUE  - READ-BACK PROCESS PERFORMED.  Result(s) reported on 21 Sep 2012 at 02:01PM.  Result Comment TROPONIN - RESULTS VERIFIED BY REPEAT TESTING.  -  C/DEBBIE [email or web address]  - READ-BACK PROCESS PERFORMED.  Result(s) reported on 21 Sep 2012 at 02:15PM.  B-Type Natriuretic Peptide [facility])  [phone]  Glucose, Serum  142  BUN  53  Creatinine (comp)  6.91  Sodium, Serum 137  Potassium, Serum 4.2  Chloride, Serum 102  CO2, Serum 28  Calcium (Total), Serum  7.0  Osmolality (calc) 291  eGFR (African American)  10  eGFR (Non-African American)  8 (eGFR values <55m/min/1.73 m2 may be an indication of chronic kidney disease (CKD). Calculated eGFR is useful in patients with stable renal function. The eGFR calculation will not be  reliable in acutely ill patients when serum creatinine is changing rapidly. It is not useful in  patients on dialysis. The eGFR calculation may not be applicable to patients at the low and high extremes of body sizes, pregnant women, and vegetarians.)  Anion Gap 7  Cardiac:  22-May-14 13:06   CK, Total 96  CPK-MB, Serum 1.1 (Result(s) reported on 21 Sep 2012 at 02:01PM.)  Troponin I  0.08 (0.00-0.05 0.05 ng/mL or less: NEGATIVE  Repeat testing in 3-6 hrs  if clinically indicated. >0.05 ng/mL: POTENTIAL  MYOCARDIAL INJURY. Repeat  testing in 3-6 hrs if  clinically indicated. NOTE: An increase or decrease  of 30% or more on serial  testing suggests a  clinically important change)  Routine Coag:  22-May-14 13:06   D-Dimer, Quantitative  > 6.00 (If the D-dimer test is being used to assist in the exclusion of DVT and/or PE, note the following:  In various studies concerning the D-dimer methodology (STA Liatest) in use by this laboratory, it has been reported that with a cut-off value of 0.50 ug/mL FEU, the  negative predictive value regarding the exclusion of thrombosis is within the 95-100% range.  In patients with high pre-test probability of DVT/PE the results of the D-dimer test should be correlated with other diagnostic and clinical assessment modalities." Reference: [employer], 2005.)  Routine Hem:  22-May-14 13:06   WBC (CBC)  27.6  RBC (CBC)  3.82  Hemoglobin (CBC)  8.3  Hematocrit (CBC)  27.7  Platelet Count (CBC)  475 (Result(s) reported on 21 Sep 2012 at 01:22PM.)  MCV  73  MCH  21.8  MCHC  30.0  RDW  18.6    Impression 1.  Complication of dialysis device          will replace the catheter same site 2.  End stage renal disease          continue dialysis once access secured 3.  Pulmonary edema          [facility] 4.  Diabetes          ADA diet          sliding scale 5.  Obesity           nutritional evaluation   Plan level 4    Electronic Signatures: SHortencia Pilar(MD)  (Signed 22-May-14 19:35)  Authored: General Aspect/Present Illness, Home Medications, Allergies, History and Physical Exam, Vital Signs, Labs, Impression/Plan   Last Updated: 22-May-14 19:35 by SHortencia Pilar(MD)

## 2014-08-23 NOTE — H&P (Signed)
PATIENT NAME:  [name] [name], [name] [name] MR#:  [id number] DATE OF BIRTH:  06-Feb-1961  DATE OF ADMISSION:  01/13/2013  PRIMARY CARE PHYSICIAN: Dr. Burnadette Pop.   CHIEF COMPLAINT: Shortness of breath, hypoxia,   HISTORY OF PRESENT ILLNESS: A 54 year old male patient with history of end-stage renal disease on hemodialysis, diabetes mellitus, recently treated for left-sided aspiration pneumonia with pleural effusion and discharged on 01/10/2013. He returns from the dialysis center after he got shortness of breath and hypoxic in the 70s on checking his oxygen intact. The patient was placed on a nonrebreather and sent to the Emergency Room. Here, the patient was found to be hypotensive into the 80s with tachycardia into the 130s. The patient also had a white count of 52,000 with a large left-sided pleural effusion, and is being admitted.   The patient presently complains of some shortness of breath, although feels mildly better. He is still tachycardic. He is on a nonrebreather. He mentioned that he got better and then got worse since yesterday. Had some nausea, but no vomiting. Has poor appetite. He mentions he does not have any dysphagia, although he had trouble swallowing in the hospital. The patient was discharged to a rehab on 01/10/2013 by Dr. Burnadette Pop.   He does not have any PND, orthopnea or edema. He does complain of extreme fatigue.   REVIEW OF SYSTEMS:  CONSTITUTIONAL:  Complains of fatigue, weakness. No weight loss, weight gain.  EYES: No blurred vision, pain, redness.  EARS, NOSE, THROAT: No tinnitus, ear pain, hearing loss.  RESPIRATORY: Has cough, shortness of breath  CARDIOVASCULAR: No chest pain, orthopnea, edema.  GASTROINTESTINAL: He had some nausea, no vomiting, no diarrhea , abdominal pain.  GENITOURINARY: No dysuria, hematuria, frequency.  ENDOCRINE: No polyuria, nocturia, thyroid problems.  HEMATOLOGIC AND LYMPHATIC: Chronic anemia. No easy bruising, bleeding.  INTEGUMENTARY:  No acne.  Has a chronic scabbed, dry, ulcers in lower extremities.  MUSCULOSKELETAL: Has muscle wasting. Chronic pain.  NEUROLOGIC: No focal numbness, weakness, seizures.  PSYCHIATRIC: Has anxiety and depression.   PAST MEDICAL HISTORY: 1.  Osteomyelitis status post left fourth metatarsal debridement.  2.  End-stage renal disease on hemodialysis through PermCath.  3.  AV fistula.  4.  Anemia of chronic disease.  5.  Type 2 diabetes mellitus.  6.  History of atrial fibrillation in the past after surgery.  7.  Depression.  8.  Anxiety.  9.  Left-sided pleural effusion. 10.  Neuropathy.  11.  Hypertension.   PAST SURGICAL HISTORY:  1.  Left distal foot debridement.  2.  PermCath placement.  3.  Left AV fistula placement.   ALLERGIES: No known drug allergies.   SOCIAL HISTORY: The patient does not smoke. No alcohol. No illicit drugs. Is presently in a nursing home.   CODE STATUS: DNR/DNI.   FAMILY HISTORY: Mother died at age 64 with complications of stroke. Father died at 61 from diabetic complications.   HOME MEDICATIONS: Include:  1.  Gabapentin 300 mg oral 2 times a day. 2.  Glipizide 10 mg oral twice a day.  3.  Losartan 25 mg oral once a day.  4.  Remeron 15 mg oral once a day. 5.  Sevelamer 2.4 mg 3 times a day.    PHYSICAL EXAMINATION: VITAL SIGNS: Blood pressure 80/50, pulse of 136, saturating 99% on a nonrebreather. Temperature 98.  GENERAL: Critically ill-appearing Caucasian male patient lying in bed, drowsy.  PSYCHIATRIC: Alert on calling his name, tends to follow back to sleep.  HEENT: Atraumatic, normocephalic. Oral  mucosa dry and pink pallor positive. No icterus. Pupils are to light.  NECK: Supple without no thyromegaly. No palpable lymph nodes. Trachea midline. No carotid bruit, JVD.  CARDIOVASCULAR: S1, S2, tachycardic without any murmurs. Peripheral pulses feeble.  RESPIRATORY: Decreased breath sounds and crackles on the left side. Increased work of breathing, using  accessory muscles, on a nonrebreather.  GASTROINTESTINAL: Soft abdomen, nontender. Bowel sounds present. No organomegaly palpable.  SKIN:  Multiple dry scabbed ulcers in the lower extremities. No erythema or swelling noticed.  VASCULAR:  Has a PermCath in the subclavian area.  NEUROLOGICAL: Moves all 4 extremities equally.   LABORATORY DATA:  Glucose of 50, BUN 11, creatinine 2.78, sodium 134, potassium 3.6,. AST, ALT normal.  Alkaline phosphate is mildly elevated at 254.   WBC 52.2, hemoglobin 11.7, platelets of 402.   Lactic acid 1.3.   EKG shows sinus tachycardia.   Chest x-ray showed a large left-sided effusion versus infiltrate.   ASSESSMENT AND PLAN: 1.  Acute respiratory failure in a patient with left-sided pneumonia and effusion with elevated white count 52,000 recently treated for pneumonia. I am consent if the patient had an empyema. I have discussed the case with Dr. Belia HemanKasa of pulmonology who suggested we get an ultrasound for the left pleural effusion. If there is significant fluid, he will need a thoracentesis. If there is any empyema, he will need a chest tube and surgery consult. Start him on broad-spectrum antibiotics with vancomycin and meropenem. There is a concern for aspiration. We will get a swallow evaluation. Nebulizers as needed. Continue nonrebreather. The patient is a DNR/DNI.  2.  Sepsis secondary to above.  IV fluids monitor for fluid overload as he is on hemodialysis. Does not make any urine.  3.  Hypoglycemia secondary to sepsis. We will hold the glipizide, put him on Accu-Cheks frequently.  4.  Sepsis, there could be other source of infection. He has a ComptrollerermCath. Also has multiple ulcers on the lower extremities, but these ulcers look dry.  We will get one blood culture from the PermCath.  5.  End-stage renal disease. Consult nephrology for dialysis needs.   CODE STATUS: DNR/DNI.   Time spent today on this critically ill patient with acute respiratory failure, large  left effusion and possible empyema is greater than 75 minutes.     ____________________________ Molinda BailiffSrikar R. Kimi Kroft, MD srs:dp D: 01/13/2013 16:36:54 ET T: 01/13/2013 17:15:51 ET JOB#: [id number]  cc: Wardell HeathSrikar R. Elpidio AnisSudini, MD, <Dictator> Marisue IvanKanhka Linthavong, MD Dory LarsenKurian D. Kasa, MD Dr. Elson AreasSingh Brexley Cutshaw R Naelle Diegel MD ELECTRONICALLY SIGNED 01/14/2013 14:26

## 2014-08-23 NOTE — Op Note (Signed)
PATIENT NAME:  [name] [name], [name] [name] MR#:  [id number] DATE OF BIRTH:  [birth year]  DATE OF PROCEDURE:  09/13/2012  PREOPERATIVE DIAGNOSES: 1.  Endstage renal disease.  2.  Nonfunctional right jugular PermCath.   POSTOPERATIVE DIAGNOSES: 1.  Endstage renal disease.  2.  Nonfunctional right jugular PermCath.   PROCEDURE:  TPA infusion of PermCath.   SURGEON: Annice NeedyJason S. Dew, M.D.   ANESTHESIA:  None.   ESTIMATED BLOOD LOSS: None.   INDICATION FOR PROCEDURE: A 54 year old white male with end-stage renal disease. His PermCath is not flowing well. We are going to try to restore its patency with tPA infusion.   DESCRIPTION OF PROCEDURE: The patient's catheter was sterile draped and a sterile surgical field was created. The catheter was infused with 2 mg of tPA through each lumen over a 3-hour span. At the conclusion of this, we withdrew blood and flushed easily with heparinized saline and placed a concentrated heparin solution. Sterile caps were placed.     ____________________________ Annice NeedyJason S. Dew, MD jsd:cs D: 09/13/2012 16:00:25 ET T: 09/13/2012 19:16:17 ET JOB#: [id number]  cc: Annice NeedyJason S. Dew, MD, <Dictator> Annice NeedyJASON S DEW MD ELECTRONICALLY SIGNED 09/18/2012 13:55

## 2014-08-23 NOTE — H&P (Signed)
PATIENT NAME:  [name] [name], [name] [name] MR#:  [id number] DATE OF BIRTH:  27-May-1960  DATE OF ADMISSION:  12/19/2012  PRIMARY CARE PHYSICIAN: Clarene Reamer, PA-C  REFERRING EMERGENCY ROOM PHYSICIAN: Sheran Fava. Fanny Bien, MD  CHIEF COMPLAINT: Altered mental status.  HISTORY OF PRESENT ILLNESS: The patient was found unresponsive and intubated in the ER. History obtained from the ER physician and ER nurse. This is a 54 year old male who is a known dialysis patient, had not gone to his hemodialysis for 2 weeks and so his hemodialysis center called EMS to check on him and he was found unresponsive at home by EMS. After initial work-up in ER, there was not any intracranial bleed or acute finding on CT of the head, but his chest x-ray showed his left-sided infiltrate or effusion and he was intubated in ER for airway protection due to his unresponsiveness. Nephrology consult was called in by ER for urgent dialysis for uremia and possible cause of his altered mental status and hospitalist service was contacted for further management.   REVIEW OF SYSTEMS: Unable to assess, as the patient is currently unresponsive.   PAST MEDICAL HISTORY:  1.  Osteomyelitis, status post left fourth metatarsal debridement.  2.  End-stage renal disease, on hemodialysis through PermCath.  3.  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 Wyn Quaker.  4.  Anemia of chronic disease.  5.  Type 2 diabetes with neuropathy.  6.  History of episode of atrial fibrillation with RVR, status post surgery, which resolved.  PAST SURGICAL HISTORY:  1.  Left distal fourth debridement.  2.  PermCath placement.  3.  Left AV fistula placement.   ALLERGIES: No known drug allergies.   SOCIAL HABITS: As per previous record, nonsmoker, nonalcoholic.   SOCIAL HISTORY: As per previous record, lives with girlfriend. 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  FAMILY HISTORY: From old record, mother died  at 60 from complications of stroke. Father died at 16 from diabetic complications.   HOME MEDICATIONS: As verified by pharmacist:  1.  Gabapentin 300 mg oral capsule 2 times a day.  2.  Glipizide 10 mg oral tablet 2 times a day.  3.  Tramadol 50 mg 2 times a day as needed for pain.   PHYSICAL EXAMINATION: VITAL SIGNS: In ER on presentation, temperature 97.8 (rectal temperature), pulse rate ranging from 100 to 120. Respiration was 22 on presentation. Oxygen saturation was ranging in 80s on presentation and after intubation is 97. Blood pressure 157/89.  GENERAL: The patient is currently intubated and he is sedated with propofol. Does not appear in any acute distress. On vent support orally, intubated, and has Foley catheter in.  HEENT: Head and neck atraumatic. Conjunctivae pink. Pupils bilateral 2 mm, reactive to light.  NECK: Supple. No JVD.  RESPIRATORY: Decreased air entry on left lower. No crackles or rhonchi heard.  CARDIOVASCULAR: S1, S2 present, regular. No murmur. Orally intubated and has PermCath  in left upper chest. There is excessive salivary/clear secretions coming out of mouth. ABDOMEN: Bowel sounds present. No organomegaly felt. Not able to do tenderness due to  patient is sedated. Foley catheter in place. No obvious hernia. SKIN: Multiple bruises present on both legs and arms, possibly old falls.  EXTREMITIES: Legs: No edema on the legs. Left distal foot amputation. Left upper arm slightly swollen compared to right.  NEUROLOGICAL: Currently sedated. Pupils reactive. No rigidity.  LABORATORY DATA: Glucose 78, BUN 136, creatinine 8.81, sodium  138, potassium 4.8, chloride 101, CO2 of 19, calcium 8.0. Ammonia less than 25. Total protein 8, albumin 2.2, bilirubin 0.8, alkaline phosphatase 174, SGOT 50, SGPT 32. CK total 843. WBC 14.6, hemoglobin 12.4, platelet count 271, MCV 78. INR 1.4. Urinalysis: Yellow turbid urine with 2600 RBCs and 84 WBCs with 3+ bacteria, leukocyte esterase  negative. ABG with pH of 7.36, pCO2 of 27 and pO2 of 80 on 32% FiO2 on arrival with lactic acid 1 and after intubation, pH of 7.40, pCO2 of 24 and pO2 of 93 with 50% FiO2, assisted control mode on the ventilator.  RADIOLOGICAL STUDIES: CT of the head: No acute abnormality. Chest x-ray, portable: Increased opacity in left hemithorax, which could represent loculated fluid, infiltrate or mass. Pulmonary vascular congestion with mild interstitial edema.   ASSESSMENT AND PLAN: A 54 year old male who is a dialysis patient and with history of diabetes and amputations due to osteomyelitis, presented to the Emergency Room with altered mental status and unresponsive, intubated.  1.  Altered mental status and unresponsiveness due to metabolic encephalopathy, possibly due to uremia and due to infection. Currently intubated and on ventilatory support. Will monitor in Critical Care Unit. Will check urine for toxicology, as it is not done by Emergency Room yet, and for possible uremia, urgent nephrology consult is called in and he will get dialysis soon.  2.  Systemic inflammatory response syndrome secondary to pneumonia. The patient is having unresponsiveness on presentation, tachycardia and tachypnea on presentation and elevated white cell count. Has left-side infiltrate versus effusion on the chest x-ray. Status post intubation. Will give broad-spectrum coverage, as he is on dialysis and found unresponsive. Will do blood cultures, sputum culture and pulmonary consult.  3.  Acute respiratory failure due to pneumonia and unresponsiveness. Currently intubated. Monitor in Critical Care Unit and pulmonary consult.  4.  End-stage renal disease: Nephrology consult for further management.  5.  Diabetes: Insulin with sliding-scale coverage.   CODE STATUS: Full code.   CONDITION: Critical due to respiratory failure and unresponsiveness. Will monitor in Critical Care Unit.   TOTAL CRITICAL CARE TIME SPENT: 60 minutes in  this admission.    ____________________________ Hope PigeonVaibhavkumar G. Elisabeth PigeonVachhani, MD vgv:jm D: 12/19/2012 13:54:56 ET T: 12/19/2012 14:26:40 ET JOB#: [id number]  cc: Hope PigeonVaibhavkumar G. Elisabeth PigeonVachhani, MD, <Dictator> Altamese DillingVAIBHAVKUMAR Jakub Debold MD ELECTRONICALLY SIGNED 01/06/2013 15:41

## 2014-08-23 NOTE — H&P (Signed)
PATIENT NAME:  [name] [name], [name] [name] MR#:  [id number] DATE OF BIRTH:  [birth year]  DATE OF ADMISSION:  01/23/2013  PRIMARY CARE PHYSICIAN: Dion Body, MD  CHIEF COMPLAINT: Syncope, altered mental status.   HISTORY OF PRESENTING ILLNESS: A 54 year old Caucasian male patient with past medical history of osteomyelitis, pneumonias, pleural effusions and endstage renal disease on dialysis who had a prolonged stay and was discharged on 01/22/2013 after being treated for pneumonia with septic shock, vent-dependent respiratory failure, returns from dialysis center after he had an episode of syncope and altered mental status. The patient was 6 minutes into his dialysis when he had an episode of syncope with which he broke up immediately, but had brief episode of altered mental status and was sent to the Emergency Room. Here, the patient has returned back to his normal self, but has been found to have leukocytosis up to 33,000 and the last WBC was on 01/19/2013 at 18,000. The patient was not on any antibiotics at discharge.   He feels weak at this time and mentions that he went to rehab.   The patient was seen by palliative care during the last admission. He is DNR/DNI. He is supposed to move to [place] closer to family once they can set up a skilled nursing facility there on which social work is working.  REVIEW OF SYSTEMS: CONSTITUTIONAL: Complains of some fatigue, weakness. No weight loss, weight gain.  EYES: No blurred vision, pain or redness.  HEENT: No tinnitus, ear pain or hearing loss. He does complain of pain in his neck which he says is chronic.  RESPIRATORY: No cough, shortness of breath.  CARDIOVASCULAR: No chest pain, orthopnea, edema.  GASTROINTESTINAL: No nausea, vomiting, diarrhea.  GENITOURINARY: No dysuria, hematuria or frequency.  ENDOCRINE: No polyuria, nocturia or thyroid problems.  HEMATOLOGIC AND LYMPHATIC: Has chronic anemia. No easy bruising, bleeding.  INTEGUMENTARY: No acne.  Has multiple scabs and ulcers in the lower extremities, which are dressed. No discharge or erythema.  MUSCULOSKELETAL: Has muscle wasting, chronic pain.  NEUROLOGIC: No focal numbness, weakness, seizures.  PSYCHIATRIC: No anxiety or depression.   PAST MEDICAL HISTORY: 1.  Osteomyelitis status post left fourth metatarsal debridement and partial foot amputation.  2.  End-stage renal disease on hemodialysis through a PermCath.  3.  AV fistula. 4.  Anemia of chronic disease.  5.  Type 2 diabetes mellitus.  6.  History of atrial fibrillation in the past after surgery.  7.  Depression.  8.  Anxiety.  9.  Left-sided pleural effusion.  10.  Neuropathy.  11. Hypertension.  12.  Malnutrition.   PAST SURGICAL HISTORY:  1.  Left foot partial amputation.  2.  PermCath placement.  3.  Left AV fistula placement.   ALLERGIES: No known drug allergies.   SOCIAL HISTORY: The patient does not smoke. No alcohol. No illicit drugs. Is presently in a nursing home.   CODE STATUS: DNR/DNI.   FAMILY HISTORY: Mother died at the age of 34 from complications of stroke. Father died at 41 from diabetic complications.   HOME MEDICATIONS: Include:  1.  Losartan 25 mg oral once a day.  2.  Remeron 15 mg oral once a day.  3.  Risperidone 0.5 mg oral 4 times a day.  4.  Sevelamer 2.5 grams 3 times a day.   PHYSICAL EXAMINATION: VITAL SIGNS: Temperature 98.8, pulse 108, blood pressure 90/59, saturating 99% on room air.  GENERAL: Critically ill-appearing Caucasian male, patient lying in bed, seems comfortable, is conversational.  PSYCHIATRIC:  Alert and awake. HEENT: Atraumatic, normocephalic. Oral mucosa dry and pink. No oral ulcers or thrush. External ears and nose normal. No pallor. No icterus. Pupils bilateral equal and reactive to light.  NECK: Supple. No thyromegaly. No palpable lymph nodes. Trachea midline. No carotid bruit, JVD. Has erythema on the right side of the neck without any fluctuance or  swelling.  HEART: S1 and S2 tachycardic without any murmurs. Peripheral pulses feeble.  LUNGS:  Good air entry on both sides. Clear to auscultation on both sides. Normal work of breathing. ABDOMEN: Soft. Nontender. Bowel sounds present. No hepatosplenomegaly palpable.  SKIN: Warm and dry. Has multiple dry scabs and ulcers on the lower extremities. No erythema or fluctuance noticed. No purulent discharge. No other rash noted.  VASCULAR: Has a PermCath in the subclavian area.  NEUROLOGIC: Motor strength 4/5 in lower extremities, 5/5 in upper extremities.   LABORATORY AND DIAGNOSTICS: Glucose 150, BUN 37, creatinine 6.19, sodium 138, potassium 3.7, chloride 102, bicarb 28. AST and ALT normal. Alk phos 316. Troponin less than 0.02. WBC 33.3, hemoglobin 10, platelets 467 and neutrophils 91%.   EKG shows sinus tachycardia.   Chest x-ray shows no infiltrates. No edema.   ASSESSMENT AND PLAN: 1.  Syncope. The patient's blood pressure is borderline low and his syncope was after he was started on dialysis. I suspect he could have got hypotensive which caused his symptoms. We will check 2 more sets of cardiac enzymes to rule out any acute coronary syndrome and keep an eye on the heart monitor through tele. The patient will be on a tele floor. We will hold his blood pressure medications at this time.  2.  Leukocytosis. The patient did have persistent leukocytosis prior to discharge in spite of his pneumonia is clearing, but this is worsening into 30,000 at this time. He does have some erythema at the right side of his neck, multiple ulcers on his lower extremities. I am concerned about bacteremia in this patient. He does have a history of osteomyelitis, although there is no clear site for this. We will get blood cultures, start him on broad-spectrum antibiotics with Zosyn and vancomycin. Wait for culture results.  3.  Sepsis secondary to above.  4.  End-stage renal disease on hemodialysis. I have discussed  with Dr. Candiss Norse who will be seeing the patient and continue his regular dialysis.  5.  Diabetes mellitus. The patient has had poor appetite. We will put him on sliding scale insulin. No oral hypoglycemics.  6.  Deep vein thrombosis prophylaxis with heparin.   CODE STATUS: DNR/DNI.   The patient has had recurrent admissions in the recent past, seems to be deteriorating with each admission. He might benefit from hospice. We will consult palliative care.   TIME SPENT TODAY ON THIS CASE:  55 minutes.  ____________________________ Leia Alf Revia Nghiem, MD srs:sb D: 01/23/2013 16:44:22 ET T: 01/23/2013 17:10:47 ET JOB#: [id number]  cc: Alveta Heimlich R. Seham Gardenhire, MD, <Dictator> Dion Body, MD Murlean Iba, MD Neita Carp MD ELECTRONICALLY SIGNED 01/03/2013 8:55

## 2014-08-23 NOTE — Discharge Summary (Signed)
PATIENT NAME:  [name] [name], [name] MR#:  [id number] DATE OF BIRTH:  [birth year]  DATE OF ADMISSION:  01/13/2013 DATE OF DISCHARGE: 01/22/2013  DISCHARGE DIAGNOSES: 1. Respiratory failure secondary to bilateral pneumonia.  2. End-stage renal disease on hemodialysis.   DISCHARGE MEDICATIONS: 1. Mirtazapine 15 mg p.o. at bedtime.  2. Sevelamer 2.4 grams 3 times a day with meals.  3. Losartan 25 mg p.o. daily. Please hold if blood pressure is less than 120/80.  4. Risperidone 0.5 mg 1 tab p.o. 4 times a day.   CONSULTANTS: Nephrology, psychiatry.   PERTINENT LABS AND STUDIES: Chest x-ray was consistent with bilateral pneumonia. On day of discharge blood glucose of 118. Last chemistries on 09/20 showed a phosphorus of 2.7, potassium 3.3, chloride 102, sodium 139, BUN 23, creatinine 3.54.   BRIEF HOSPITAL COURSE:  1. Acute respiratory failure. The patient initially came in with complaints of worsening dyspnea, acute respiratory failure with bilateral pneumonia with recent hospitalization and intubation.  On the day of discharge, his breathing was much improved. He was not on any further oxygen and was currently on room air and satting well. He was anxious during his hospital stay, at times was very delirious and was hallucinating. Psychiatry evaluated the patient. Placed the patient on mirtazapine and risperidone.  On day of discharge the patient was coherent and alert and is answering questions appropriately and wanted to be discharged. He will be [facility] where he will receive further nursing and therapy. The patient has had trouble eating.  Has not endorsed or had capacity to endorse a feeding tube, NG tube in the past, therefore that was withheld at this time. The ultimate plan is for the patient to be in [place] with his family and that is being worked on by case Insurance account managermanagement. He is stable from a medical standpoint at this time to be discharged from the hospital. Follow up with  Dr. Burnadette PopLinthavong if necessary if [place] in [place] .   ____________________________ Marisue IvanKanhka Adric Wrede, MD kl:sg D: 01/22/2013 11:30:04 ET T: 01/22/2013 12:28:08 ET JOB#: [id number]  cc: Marisue IvanKanhka Courteny Egler, MD, <Dictator> Marisue IvanKANHKA Allesha Aronoff MD ELECTRONICALLY SIGNED 01/29/2013 13:32

## 2014-08-23 NOTE — H&P (Signed)
PATIENT NAME:  [name] [name], SWEEDEN MR#:  [id number] DATE OF BIRTH:  [birth year]  DATE OF ADMISSION:  08/17/2012  PRIMARY CARE PHYSICIAN:  Dr. Burnadette Pop.    REFERRING PHYSICIAN:  Dr. Jene Every.   CHIEF COMPLAINT:  Syncope.   HISTORY OF PRESENT ILLNESS:  The patient is a 54 year old Caucasian male with history of chronic kidney disease stage 3, diabetes mellitus type 2.  He is followed up by Dr. Cherylann Ratel as his nephrologist.  The patient tells me that he advised at one time to start dialysis, however patient is refusing.  The patient was in his usual state of health until today while he was at [employer], he felt dizzy, then he went to sit and he states that he had an episode of syncope when he had brief loss of consciousness, but he did not fall to the floor.  The patient came to the hospital for evaluation.  Here he was found to have advanced renal failure.  His creatinine is up to 9 and his baseline creatinine in December was 2.3.  He has other electrolyte abnormalities including hypocalcemia, hypomagnesemia.  He also has elevated white blood cell count reaching 18,000.  The patient has no fever, but he reports he has some chills in the last couple of days.  He also indicated that he has mild sore throat and a little dry cough in the last two days.   REVIEW OF SYSTEMS:  CONSTITUTIONAL:  The patient did not have fever, but he has mild chills.  No fatigue.  EYES:  No blurring of vision.  No double vision.  EARS, NOSE, THROAT:  No hearing impairment.  He has mild sore throat.  No dysphagia.  CARDIOVASCULAR:  No chest pain.  No shortness of breath.  He has some peripheral edema in the left more than the right leg.  He had one episode of syncope today.  RESPIRATORY:  No shortness of breath.  No chest pain.  No hemoptysis.  GASTROINTESTINAL:  No abdominal pain, no vomiting, no diarrhea.  GENITOURINARY:  No dysuria.  No frequency of urination, but he has difficulty to urinate.  He has to strain.   MUSCULOSKELETAL:  No joint pain or swelling.  No muscular pain or swelling.  INTEGUMENTARY:  No skin rash.  No ulcers.  NEUROLOGY:  No focal weakness.  No seizure activity.  No headache, however he had an episode of syncope as above.  PSYCHIATRY:  He has some anxiety, but no depression.  ENDOCRINE:  No polyuria or polydipsia.  No heat or cold intolerance.   PAST MEDICAL HISTORY:  Last admission here was in December of 2013.  He had Fournier gangrene and scrotal abscess operated.  The patient also has history of chronic kidney disease stage 3, but right now it is more advanced.  Diabetes mellitus type 2.  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  Finally, he has anemia of chronic diseases.  He also has a history of hypoalbuminemia.   SOCIAL HABITS:  Nonsmoker, never smoked.  No history of alcohol or drug abuse.   SOCIAL HISTORY:  He lives with his girlfriend.  He retired from work.  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   FAMILY HISTORY:  His mother died at the age of 33 from complications of stroke.  His father died at the age of 37 from diabetic complications.  He had diabetes mellitus.  ADMISSION MEDICATIONS:  Sodium bicarbonate 1300 mg twice a day, glipizide 10 mg twice a day, gabapentin 300 mg twice a day, furosemide 20 mg a day, amlodipine 2.5 mg a day.   ALLERGIES:  No known drug allergies.   PHYSICAL EXAMINATION: VITAL SIGNS:  Blood pressure 143/80, respiratory rate 22, pulse 103, pulse oximetry 95%.  GENERAL APPEARANCE:  Middle-aged male lying in bed in no acute distress.  HEAD AND NECK:  Mild pallor.  No icterus.  No cyanosis.  Ear examination revealed normal hearing, no discharge, no lesions.  Examination of the nose showed no discharge, no ulcers, no bleeding.  Oropharyngeal examination revealed no ulcers, no exudate, no oral thrush.  Examination of the eyes revealed  normal eyelids, normal conjunctivae.  Pupils were constricted.  NECK:  Supple.  Trachea at midline.  No thyromegaly.  No cervical lymphadenopathies.  HEART:  Normal S1, S2.  No S3, S4.  No murmur.  No gallop.  No friction rub.  RESPIRATORY:  Revealed normal breathing pattern without use of accessory muscles.  No rales.  No wheezing.  ABDOMEN:  Obese, soft without tenderness.  No hepatosplenomegaly.  No masses.  No hernias.  SKIN:  Revealed no ulcers.  No subcutaneous nodules.  He has some peripheral edema in the lower extremities in the left more than the right.  A few ecchymotic skin lesions were noted.  MUSCULOSKELETAL:  No joint swelling.  No clubbing.  His left distal foot has a prior amputation.  NEUROLOGIC:  Cranial nerves II through XII are intact.  No focal motor deficit.  PSYCHIATRIC:  The patient alert, oriented x 3.  Mood and affect were normal, although he looks slightly anxious.   LABORATORY FINDINGS:  CT scan of the abdomen showed some basilar lung atelectasis.  Bladder is moderately distended.  Prostate is enlarged.  Large left hydrocele.  Multiple enlarged left inguinal and external iliac lymph nodes, nonspecific.  Moderate to large pericardial effusion.  The density of some of the pericardial fluid is slightly greater than water suggesting proteinaceous material or blood products.  Serum glucose 185, BUN 100, creatinine 9.2, his baseline creatinine is ranging between 2 to 3 last year.  In December of 2013 it was 2.3.  His serum sodium 135, potassium 4.8, serum bicarbonate was 18.  His anion gap was 16.  Calcium was low at 5.1, however calculated calcium according to his albumin is 7.02.  His magnesium is low at 1.6.  Total protein 7.4, albumin 1.6.  AST, ALT were normal.  Alkaline phosphatase 255.  Troponin less than 0.02.  CBC showed a white count of 18,000, hemoglobin 9, hematocrit 29, platelet count 518.  MCV 76.  Urinalysis showed cloudy urine, more than 500 protein, 8 white blood  cells, 9 RBCs.  Chest x-ray showed cardiomegaly, but no consolidation.   ASSESSMENT: 1.  Acute renal failure on chronic renal failure.  The stage is now advanced.  2.  Syncope.  3.  Moderate to large pericardial effusion. 4.  Hypocalcemia.  5.  Hypomagnesemia.  6.  Anemia.  7.  Hypoalbuminemia.  8.  Prostatic enlargement.  9.  Leukocytosis.  10.  Diabetes mellitus, type 2.  11.  Proteinuria.   PLAN:  We will admit the patient to the medical floor with telemetry monitoring.  Gentle IV hydration.  Send urine for culture.  I will start the patient on antibiotic using Rocephin 500 mg daily pending results of the urine culture.  Cardiology consultation to look into his pericardial effusion  issue.  Likely it is related to his advanced renal failure and this raises possibility of hemopericardium.  ER physician already contacted Dr. Kirke CorinArida and he recommended doing echocardiogram in the morning.  I ordered that.  Consult Dr. Cherylann RatelLateef who is his nephrologist and familiar with his condition.  I wanted to order a Foley catheter to have adequate drainage since there is some urinary retention and prostatic enlargement, however patient is refusing.  He also tells me that he does not want to stay in the hospital only a day or two and he has to leave.  He had refused dialysis before and he has not changed his mind yet.  He also tells me that his CODE STATUS in the past is DO NOT RESUSCITATE, BUT NOW HE WANTS TO BE FULL CODE.  Calcium and magnesium supplementation to correct the hypocalcemia and hypomagnesemia.  His hypocalcemia and is probably related to 1, 25 dihydroxy vitamin D deficiency.   Time spent in evaluating this patient took more than 1 hour and 15 minutes due to its complexity and discussions with the patient regarding his conditions and the findings.  Critical care time and management was more than 30 minutes.     ____________________________ Carney CornersAmir M. Rudene Rearwish, MD amd:ea D: 08/17/2012 23:48:18  ET T: 08/18/2012 02:25:16 ET JOB#: [id number]  cc: Carney CornersAmir M. Rudene Rearwish, MD, <Dictator> Karolee OhsAMIR Dala DockM Lorene Samaan MD ELECTRONICALLY SIGNED 08/18/2012 22:50

## 2014-08-23 NOTE — Consult Note (Signed)
Brief Consult Note: Diagnosis: Diabeteic foot ulcer with osteomyleitis - s.p surgery, cx wtih enterococcus and other organism growing.   Patient was seen by consultant.   Consult note dictated.   Recommend to proceed with surgery or procedure.   Recommend further assessment or treatment.   Orders entered.   Discussed with Attending MD.   Comments: Will discuss with Dr Alberteen Spindleline how much infected bone remains At this point likely ok for prolonged oral therapy - start augmentin 500 daily and cont cipro until cx results finalized I will follow up as otpt to adjust abx.  Electronic Signatures: Dierdre HarnessFitzgerald, Tanyia Grabbe Patrick (MD)  (Signed 23-Apr-14 16:34)  Authored: Brief Consult Note   Last Updated: 23-Apr-14 16:34 by Dierdre HarnessFitzgerald, Alailah Safley Patrick (MD)

## 2014-08-23 NOTE — Consult Note (Signed)
CHIEF COMPLAINT and HISTORY:  Subjective/Chief Complaint poor venous access   History of Present Illness Patient with ESRD who is admitted with respiratory failure.  He apparently missed 2-3 dialysis treatments.  Here intubated, sedated with poor venous access.  we are asked to place line for venous access   PAST MEDICAL/SURGICAL HISTORY:  Past Medical History:   dialysis:    Scrotal abscess:    Diabetes Mellitus, Type II (NIDD):    amputation of toes:   ALLERGIES:  Allergies:  No Known Allergies:   HOME MEDICATIONS:  Home Medications: Medication Instructions Status  gabapentin 300 mg oral capsule 1 cap(s) orally 2 times a day Active  glipiZIDE 10 mg oral tablet 1 tab(s) orally 2 times a day with food as directed Active  traMADol 50 mg oral tablet 1 tab(s) orally 2 times a day, As Needed - for Pain Active   Family and Social History:  Family History Diabetes Mellitus  Stroke   Place of Living Home   Review of Systems:  ROS Pt not able to provide ROS   Medications/Allergies Reviewed Medications/Allergies reviewed   Physical Exam:  GEN disheveled, critically ill appearing   HEENT pink conjunctivae, poor dentition   NECK No masses  trachea midline   RESP rhonchi  on ventilator   CARD regular rate  no JVD   VASCULAR ACCESS AV fistula present  Good bruit  Good thrill  left radiocephalic AVF   ABD soft  hypoactive BS   LYMPH negative neck, negative axillae   EXTR negative cyanosis/clubbing, positive edema   SKIN normal to palpation, skin turgor good   NEURO sedated   PSYCH sedated   LABS:  Laboratory Results: Hepatic:    19-Aug-14 10:33, Comprehensive Metabolic Panel  Bilirubin, Total 0.8  Alkaline Phosphatase 174  SGPT (ALT) 32  SGOT (AST) 50  Total Protein, Serum 8.0  Albumin, Serum 2.2  Routine BB:    19-Aug-14 10:33, Type and Antibody Screen  ABO Group + Rh Type   A Positive  Antibody Screen NEGATIVE  Result(s) reported on 19 Dec 2012 at  01:07PM.  Lab:    19-Aug-14 10:43, ABG  pH (ABG) 7.36  PCO2 27  PO2 80  FiO2 32  Base Excess -8.6  HCO3 15.3  O2 Saturation 93.7  Specimen Site (ABG)   RT RADIAL  Patient Temp (ABG) 37.0  Result(s) reported on 19 Dec 2012 at 10:45AM.    19-Aug-14 13:15, ABG  pH (ABG) 7.40  PCO2 24  PO2 93  FiO2 50  Base Excess -8.0  HCO3 14.9  O2 Saturation 95.7  O2 Device vent  Specimen Site (ABG)   RT RADIAL  Specimen Type (ABG) ARTERIAL  Patient Temp (ABG) 37.0  Mode   ASSIST CONTROL  Vt 550  PEEP 5.0  Mechanical Rate 20  Result(s) reported on 19 Dec 2012 at 01:20PM.  Routine Chem:    19-Aug-14 10:33, Comprehensive Metabolic Panel  Glucose, Serum 78  BUN 136  Creatinine (comp) 8.81  Sodium, Serum 138  Potassium, Serum 4.8  Chloride, Serum 101  CO2, Serum 19  Calcium (Total), Serum 8.0  Osmolality (calc) 319  eGFR (African American) 7  eGFR (Non-African American) 6  eGFR values <34m/min/1.73 m2 may be an indication of chronic  kidney disease (CKD).  Calculated eGFR is useful in patients with stable renal function.  The eGFR calculation will not be reliable in acutely ill patients  when serum creatinine is changing rapidly. It is not useful in  patients on dialysis. The eGFR calculation may not be applicable  to patients at the low and high extremes of body sizes, pregnant  women, and vegetarians.  Anion Gap 18    19-Aug-14 10:49, Ammonia, Plasma  Ammonia, Plasma < 25  Result(s) reported on 19 Dec 2012 at 11:29AM.    19-Aug-14 13:34, Phosphorus, Serum  Phosphorus, Serum 7.2  Result(s) reported on 19 Dec 2012 at 02:00PM.  Urine Drugs:    19-Aug-14 11:43, Urine Drug Screen, Qual  Tricyclic Antidepressant, Ur Qual (comp) NEGATIVE  Result(s) reported on 19 Dec 2012 at 03:36PM.  Amphetamines, Urine Qual. NEGATIVE  MDMA, Urine Qual. NEGATIVE  Cocaine Metabolite, Urine Qual. NEGATIVE  Opiate, Urine qual NEGATIVE  Phencyclidine, Urine Qual. NEGATIVE  Cannabinoid, Urine  Qual. NEGATIVE  Barbiturates, Urine Qual. NEGATIVE  Benzodiazepine, Urine Qual. NEGATIVE  -----------------  The URINE DRUG SCREEN provides only a preliminary, unconfirmed  analytical test result and should not be used for non-medical   purposes.  Clinical consideration and professional judgment should be   applied to any positive drug screen result due to possible  interfering substances.  A more specific alternate chemical method  must be used in order to obtain a confirmed analytical result.  Gas  chromatography/mass spectrometry (GC/MS) is the preferred  confirmatory method.  Methadone, Urine Qual. NEGATIVE  Cardiac:    19-Aug-14 10:33, Cardiac Panel  CK, Total 843  CPK-MB, Serum 8.1  Result(s) reported on 19 Dec 2012 at 11:13AM.    19-Aug-14 13:34, Troponin I  Troponin I < 0.02  0.00-0.05  0.05 ng/mL or less: NEGATIVE   Repeat testing in 3-6 hrs   if clinically indicated.  >0.05 ng/mL: POTENTIAL   MYOCARDIAL INJURY. Repeat   testing in 3-6 hrs if   clinically indicated.  NOTE: An increase or decrease   of 30% or more on serial   testing suggests a   clinically important change  Routine UA:    19-Aug-14 11:43, Urinalysis  Color (UA) Yellow  Clarity (UA) Turbid  Glucose (UA) 50 mg/dL  Bilirubin (UA) Negative  Ketones (UA) 1+  Specific Gravity (UA) 1.019  Blood (UA) 3+  pH (UA) 5.0  Protein (UA) >=500  Nitrite (UA) Negative  Leukocyte Esterase (UA) Negative  Result(s) reported on 19 Dec 2012 at 12:03PM.  RBC (UA)   2675 /HPF  WBC (UA) 84 /HPF  Bacteria (UA) 3+  Epithelial Cells (UA)   NONE SEEN  WBC Clump (UA) PRESENT  Mucous (UA) PRESENT  Result(s) reported on 19 Dec 2012 at 12:03PM.  Routine Coag:    19-Aug-14 10:33, Activated PTT  Activated PTT (APTT) 36.6  A HCT value >55% may artifactually increase the APTT. In one study,  the increase was an average of 19%.  Reference: "Effect on Routine and Special Coagulation Testing Values  of Citrate  Anticoagulant Adjustment in Patients with High HCT Values."  American Journal of Clinical Pathology 2006;126:400-405.    19-Aug-14 10:33, Prothrombin Time  Prothrombin 17.5  INR 1.4  INR reference interval applies to patients on anticoagulant therapy.  A single INR therapeutic range for coumarins is not optimal for all  indications; however, the suggested range for most indications is  2.0 - 3.0.  Exceptions to the INR Reference Range may include: Prosthetic heart  valves, acute myocardial infarction, prevention of myocardial  infarction, and combinations of aspirin and anticoagulant. The need  for a higher or lower target INR must be assessed individually.  Reference: The Pharmacology and Management of the  Vitamin K   antagonists: the seventh ACCP Conference on Antithrombotic and  Thrombolytic Therapy. GLOVF.6433 Sept:126 (3suppl): [id number].  A HCT value >55% may artifactually increase the PT.  In one study,   the increase was an average of 25%.  Reference:  "Effect on Routine and Special Coagulation Testing Values  of Citrate Anticoagulant Adjustment in Patients with High HCT Values."  American Journal of Clinical Pathology 2006;126:400-405.  Routine Hem:    19-Aug-14 10:33, Hemogram, Platelet Count  WBC (CBC) 14.6  RBC (CBC) 4.92  Hemoglobin (CBC) 12.4  Hematocrit (CBC) 38.3  Platelet Count (CBC) 271  Result(s) reported on 19 Dec 2012 at 10:56AM.  MCV 78  MCH 25.1  MCHC 32.3  RDW 21.9   ASSESSMENT AND PLAN:  Assessment/Admission Diagnosis ESRD, respiratory failure, poor venous access   Plan central line placed at bedside without difficulty   Electronic Signatures: Algernon Huxley (MD)  (Signed 19-Aug-14 17:25)  Authored: Chief Complaint and History, PAST MEDICAL/SURGICAL HISTORY, ALLERGIES, HOME MEDICATIONS, Family and Social History, Review of Systems, Physical Exam, LABS, Assessment and Plan   Last Updated: 19-Aug-14 17:25 by Algernon Huxley (MD)

## 2014-08-23 NOTE — Discharge Summary (Signed)
PATIENT NAME:  [name] [name], [name] [name] MR#:  [id number] DATE OF BIRTH:  03/22/1961  DATE OF ADMISSION:  01/23/2013 DATE OF DISCHARGE:  01/24/2013   DISCHARGE DIAGNOSES:  1. Altered mental status.  2. Leukocytosis.  3. End-stage renal disease, on hemodialysis.   DISCHARGE MEDICATIONS:  As prescribed per home hospice.   BRIEF HOSPITAL COURSE:  1. Altered mental status. The patient initially came in with noticeable changes in mental status while undergoing dialysis. During his hospital stay, he has regained his baseline status and is alert and oriented to person, place and time. 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  2. Leukocytosis. This is an ongoing issue. He was treated recently for bilateral pneumonia. He was placed on Zosyn upon admission, and his white blood cell count is trending down. He is afebrile. Denies any cough, chest discomfort, fevers, chills or night sweats. Further therapy per hospice.  3. End-stage renal disease, on hemodialysis. After discussion per palliative care, POA of the patient decided that they would stop the hemodialysis and pursue hospice care.   DISPOSITION: Is to be transferred to the [facility] under the palliative care services and hospice care. I can follow up as needed.   CODE STATUS: The patient is a DNR/DNI.   ____________________________ Marisue IvanKanhka Una Yeomans, MD kl:OSi D: 01/24/2013 11:37:54 ET T: 01/24/2013 11:50:25 ET JOB#: [id number]  cc: Marisue IvanKanhka Cleaster Shiffer, MD, <Dictator> Marisue IvanKANHKA Clotiel Troop MD ELECTRONICALLY SIGNED 01/29/2013 13:32

## 2014-08-23 NOTE — Consult Note (Signed)
   Comments   I spoke with pt in the presence of his cousin, Jocelyn Lameram Smith. Pt is much more alert and talkative than the last time I saw him. His cousin has suggested to him that he return to [place] to be near family and pt agrees with this. Will ask CM to assist. I also asked pt whether or not he wants to continue dialysis and pt says that he does. He understands that if he reaches a point when he no longer wants dialysis, he can stop. I spoke with pt about intubation and he does not want re-intubation and wants to remain a DNR.  wants his niece Crist Infanteia and sister Tresa EndoKelly to be his HCPOA's. Will ask chaplain to assist in completing documentation.   Electronic Signatures: Primrose Oler, Harriett SineNancy (MD)  (Signed 15-Sep-14 14:40)  Authored: Palliative Care   Last Updated: 15-Sep-14 14:40 by Laurelai Lepp, Harriett SineNancy (MD)

## 2014-08-23 NOTE — Op Note (Signed)
PATIENT NAME:  [name] [name], [name] [name] MR#:  [id number] DATE OF BIRTH:  10/01/1960  DATE OF PROCEDURE:  09/13/2012  PREOPERATIVE DIAGNOSES: 1.  Endstage renal disease.  2.  Nonfunctional right jugular PermCath.   POSTOPERATIVE DIAGNOSES: 1.  Endstage renal disease.  2.  Nonfunctional right jugular PermCath.   PROCEDURE:  TPA infusion of PermCath.   SURGEON: Jacobie Stamey S. Taiquan Campanaro, M.D.   ANESTHESIA:  None.   ESTIMATED BLOOD LOSS: None.   INDICATION FOR PROCEDURE: A 54-year-old white male with end-stage renal disease. His PermCath is not flowing well. We are going to try to restore its patency with tPA infusion.   DESCRIPTION OF PROCEDURE: The patient's catheter was sterile draped and a sterile surgical field was created. The catheter was infused with 2 mg of tPA through each lumen over a 3-hour span. At the conclusion of this, we withdrew blood and flushed easily with heparinized saline and placed a concentrated heparin solution. Sterile caps were placed.     ____________________________ Livy Ross S. Karynn Deblasi, MD jsd:cs D: 09/13/2012 16:00:25 ET T: 09/13/2012 19:16:17 ET JOB#: [id number]  cc: Kennith Morss S. Nupur Hohman, MD, <Dictator> Mathilda Maguire S Defne Gerling MD ELECTRONICALLY SIGNED 09/18/2012 13:55 

## 2014-08-23 NOTE — Consult Note (Signed)
Referring Physician:   Dion Body :   Primary Care Physician:  Rebekah Chesterfield, [address], [place], [place] [address], [place] [phone]  Reason for Consult: Admit Date: 29-Dec-2012  Chief Complaint: Altered mental status  Reason for Consult: altered mental status   History of Present Illness: History of Present Illness:   year old man with a complex medical history as outlined below currently with AMS.  Patient is interactive and engaging with me.  Makes efforts to answer my questions.  Says he doesnt feel good, does not clarify.  History is gathered from prior notes and communication with other providers.  I have reviewed psych consultation and Dr. Doy Hutching' and Linthavong's notes.  Patient is still altered.  Symptoms constant.  Nothing makes symptoms better or worse.  No clear provoking factors.  Symptoms severe as resulting in persistent hospitalization.      MEDICAL HISTORY 1.  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, 06-08-07  Family Hx: Father- Deceased at age 50; no heart dz or CADeceased at age 73; lymphoma1 brother and 2 sisters; no heart dz or CA  Social EF:EOFHQR- Singleby himselfRetiredhx- Nonehx- Noneuse- NoneNonehx- Goes to gym 3x/weeklyaffliation- [employer]    gabapentin capsule (gabapentin) 300 mg  - Take 1 capsule by mouth three times a day tablet (glipizide) 10 mg  - Take 1 tablet by mouth twice a day with mealstablet (tramadol) 50 mg  - Take 1 tablet by mouth twice a day as needed for pain [Take PRN pain]tablet (losartan) 25 mg  - Take 1 tablet by mouth once a day capsule (calcium acetate) 667 mg [2 caps w/ meals per nephrology]     ROS:  General denies complaints   Lungs no  complaints   Cardiac no complaints   GI no complaints   GU no complaints   Musculoskeletal no complaints   Extremities no complaints   Skin no complaints   Endocrine no complaints   Psych no complaints   Past Medical/Surgical Hx:  dialysis:   Scrotal abscess:   Diabetes Mellitus, Type II (NIDD):   amputation of toes:   Home Medications: Medication Instructions Last Modified Date/Time  gabapentin 300 mg oral capsule 1 cap(s) orally 2 times a day 19-Aug-14 10:36  glipiZIDE 10 mg oral tablet 1 tab(s) orally 2 times a day with food as directed 19-Aug-14 10:36  traMADol 50 mg oral tablet 1 tab(s) orally 2 times a day, As Needed - for Pain 19-Aug-14 10:36   [facility] Current Meds:   Dextrose 5%-NaCl 0.45%, 1000 ml at 40 ml/hr  Ondansetron injection, ( Zofran injection )  4 mg, IV push, q4h PRN for Nausea/Vomiting  Indication: Nausea/ Vomiting  Nursing Saline Flush, 3 to 6 ml, IV push, Q1M PRN for IV Maintenance  Line Flush - Normal Saline, 5 ml, IV push, daily  Indication: Line patency, Flush each UNUSED port with 5 ml saline, 5 ml to each unused lumen daily  Line Flush - Normal Saline, 5 to 10 ml, IV push, ud PRN for line patency, Flush each lumen with 5 ml before and 10 ml after each med admin, TPN, blood draw or blood administration.  Line Flush Heparin 10 units/ml injection, 5 ml, IV push, ud PRN for line  patency  Indication: Line Patency, Monitor Anticoags per hospital protocol, Flush lumen with 50 units after med admin, TPN, blood draws or blood administration.  Line Flush Heparin 10 units/ml injection, 5 ml, IV push, daily  Indication: Line Patency, Monitor Anticoags per hospital protocol, Flush each UNUSED port with 50 units Heparin every 24 hours., Flush each unused lumen with 50 units every 24 hours.  Collagenase ointment,  ( Santyl ointment )  Apply to affected area daily  Instructions: Santyl ointment to Right knee eschar. Cover with dry dressing.  Change dressing  daily.  HePARin injection, 5000 unit(s), Subcutaneous, q12h  Indication: Anticoagulant, Monitor Anticoags per hospital protocol  Glycopyrrolate injection, 0.2 mg, IV push, q4h PRN for secretions  Indication: Decreased Secretions Pre-Surgery/Reversal Neuromuscular Bloc/GI Disorders, [Waste Code: SendToRx]  Insulin SS -Novolog injection, Subcutaneous, FSBS q6h  give no insulin if FSBS 0 - 150     2 unit(s) if FSBS 151 - 200     4 unit(s) if FSBS 201 - 250     6 unit(s) if FSBS 251 - 300     8 unit(s) if FSBS 301 - 350     10 unit(s) if FSBS 351 - 400  Call MD if Blood Glucose greater than 400, [Waste Code: Black]  MorphINE  injection, 1 to 2 mg, IV push, q2h PRN for pain  Indication: Pain, [Med Admin Window: 30 mins before or after scheduled dose]  Sevelamer Carbonate powder packet,  ( Renvela powder )  2.4 gram Nasogastric tid/wm  In 60 ml of water per packet.  Ranitidine 53m/ml syrup, ( ZanTAC syrup )  150 mg Oral daily  -Indication:Hyperacidity  Instructions:  [Dustin FolksCode: Black]  Haloperidol injection,  ( Haldol injection )  0.5 mg, IV push, q2h PRN for agitation  Indication: Psychosis/ Delirium  hydrALAZINE HCl injection, ( Apresoline injection )  10 mg, IV push, q4h PRN for hypertension  Indication: Hypertension/ CHF, give if SBP>180 or DBP >110  cefTRIAXone injection, ( Rocephin injection )  1 gram, IV Piggyback, q24h, Infuse over 30 minute(s)  Indication: Infection  meTOProlol tartrate tablet, ( Lopressor)  25 mg Oral bid  - Indication: Antihypertensive/ Angina  Allergies:  No Known Allergies:   Vital Signs: **Vital Signs.:   05-Sep-14 06:30  Vital Signs Type Routine  Temperature Temperature (F) 98.9  Celsius 37.1  Temperature Source oral  Pulse Pulse 107  Respirations Respirations 18  Systolic BP Systolic BP 1672 Diastolic BP (mmHg) Diastolic BP (mmHg) 80  Mean BP 94  Pulse Ox % Pulse Ox % 94  Pulse Ox Activity Level  At rest  Oxygen Delivery Room Air/ 21 %    EXAM: PHYSICAL EXAMINATION  GENERAL: Pleasant.  NAD.  Normocephalic and atraumatic.  EYES: Funduscopic exam shows normal disc size, appearance and C/D ratio without clear evidence of papilledema.  CARDIOVASCULAR: S1 and S2 sounds are within normal limits, without murmurs, gallops, or rubs.  MUSCULOSKELETAL: Bulk - Normal Tone - Normal Pronator Drift - No drift, preferentially uses RUE. Ambulation - Patient is on bedrest due to AMS so cannot test. Foot with partial amputation on the left.  R/L 5/4    Shoulder abduction (deltoid/supraspinatus, axillary/suprascapular n, C5) 5/4    Elbow flexion (biceps brachii, musculoskeletal n, C5-6) 5/4    Elbow extension (triceps, radial n, C7) 5/4    Finger adduction (interossei, ulnar n, T1)   5/4    Hip flexion (iliopsoas, L1/L2) 5/4    Knee flexion (hamstrings, sciatic n, L5/S1)  5/4    Knee extension (quadriceps, femoral n, L3/4) 5/3    Ankle dorsiflexion (tibialis anterior, deep fibular n, L4/5) 5/3    Ankle plantarflexion (gastroc, tibial n, S1)  NEUROLOGICAL: MENTAL STATUS: Patient is engaging and quickly and efficiently follows commands.  I tell him I am trying to figure what is making him feel bad he says that sounds like a good idea.  He asks how I am doing.  His attention and concentration are normal.  He quickly and incorrectly answers questions about what year it is, cannot answer how old he is.  Knows he is at [facility]".  Naming, repetition, comprehension and expressive speech are within normal limits.  Patient's fund of knowledge cannot be tested due to AMS.  CRANIAL NERVES: Normal    CN II (normal visual acuity and visual fields) Normal    CN III, IV, VI (extraocular muscles are intact) Normal    CN V (facial sensation is intact bilaterally) Normal    CN VII (facial strength is intact bilaterally) Normal    CN VIII (hearing is intact bilaterally) Normal    CN IX/X (palate elevates midline, normal  phonation) Normal    CN XI (shoulder shrug strength is normal and symmetric) Normal    CN XII (tongue protrudes midline)   SENSATION: Intact to pain and temp bilaterally (spinothalamic tracts) Intact to position and vibration bilaterally (dorsal columns)   REFLEXES: R/L 1+/1+    Biceps 1+/1+    Brachioradialis   1+/1+    Patellar 1+/1+    Achilles   COORDINATION/CEREBELLAR: Finger to nose testing is within normal limits..  Lab Results:  Thyroid:  20-Aug-14 05:00   Thyroid Stimulating Hormone 1.99 (0.45-4.50 (International Unit)  ----------------------- Pregnant patients have  different reference  ranges for TSH:  - - - - - - - - - -  Pregnant, first trimetser:  0.36 - 2.50 uIU/mL)  Hepatic:  20-Aug-14 05:00   Bilirubin, Total 0.8  Alkaline Phosphatase  145  SGPT (ALT) 21  SGOT (AST) 33  Total Protein, Serum  6.2  04-Sep-14 07:35   Albumin, Serum  2.2  Routine BB:  19-Aug-14 10:33   ABO Group + Rh Type A Positive  Antibody Screen NEGATIVE (Result(s) reported on 19 Dec 2012 at 01:07PM.)  Routine Micro:  22-Aug-14 10:53   Micro Text Report SPUTUM CULTURE   COMMENT                   APPEARS TO BE NORMAL FLORA AT 48 HOURS   GRAM STAIN                MODERATE WHITE BLOOD CELLS   GRAM STAIN                FEW GRAM NEGATIVE ROD   GRAM STAIN                FEW GRAM POSITIVE COCCI IN PAIRS IN CLUSTERS   ANTIBIOTIC                       Specimen Source BRONCH WASHING  Culture Comment APPEARS TO BE NORMAL FLORA AT 66 HOURS  Gram Stain 1 MODERATE WHITE BLOOD CELLS  Gram Stain 2 FEW GRAM NEGATIVE ROD  Gram Stain 3 FEW GRAM POSITIVE COCCI IN PAIRS IN CLUSTERS  Result(s) reported on 24 Dec 2012 at 11:27AM.  General Ref:  19-Aug-14 10:33   HBsAg ========== TEST NAME ==========  ========= RESULTS =========  =  REFERENCE RANGE =  HEPATITIS B SURFACE AG  HBsAg Screen HBsAg Screen                    [   Negative             ]          Negative               [facility]  [place]            No: [id number]           [address] [place], [place] [address]           Lindon Romp, MD         [phone]   Result(s) reported on 20 Dec 2012 at 11:18AM.  Routine Chem:  19-Aug-14 10:49   Ammonia, Plasma < 25 (Result(s) reported on 19 Dec 2012 at 11:29AM.)  20-Aug-14 05:00   Hemoglobin A1c [facility]) 5.4 (The American Diabetes Association recommends that a primary goal of therapy should be <7% and that physicians should reevaluate the treatment regimen in patients with HbA1c values consistently >8%.)  Cholesterol, Serum 126  HDL (INHOUSE)  10  VLDL Cholesterol Calculated  58  LDL Cholesterol Calculated 58 (Result(s) reported on 20 Dec 2012 at 05:56AM.)  27-Aug-14 04:02   Magnesium, Serum 1.9 (1.8-2.4 THERAPEUTIC RANGE: 4-7 mg/dL TOXIC: > 10 mg/dL  -----------------------)  Triglycerides, Serum  407 (Result(s) reported on 27 Dec 2012 at 04:43AM.)  30-Aug-14 10:16   Result Comment CBC - This specimen was collected through an   - indwelling catheter or arterial line.  - A minimum of 46ms of blood was wasted prior    - to collecting the sample.  Interpret  - results with caution.  Result(s) reported on 30 Dec 2012 at 11:38AM.  04-Sep-14 07:35   Phosphorus, Serum  7.2  05-Sep-14 05:09   Glucose, Serum  137  BUN  23  Creatinine (comp)  3.57  Sodium, Serum  134  Potassium, Serum 3.5  Chloride, Serum  97  CO2, Serum 28  Calcium (Total), Serum  8.4  Anion Gap 9  Osmolality (calc) 274  eGFR (African American)  21  eGFR (Non-African American)  18 (eGFR values <652mmin/1.73 m2 may be an indication of chronic kidney disease (CKD). Calculated eGFR is useful in patients with stable renal function. The eGFR calculation will not be reliable in acutely ill patients when serum creatinine is changing rapidly. It is not useful in  patients on dialysis. The eGFR calculation may not be applicable to patients at the low and high extremes of body sizes,  pregnant women, and vegetarians.)  Urine Drugs:  [id number] Tricyclic Antidepressant, Ur Qual (comp) NEGATIVE (Result(s) reported on 19 Dec 2012 at 03:36PM.)  Amphetamines, Urine Qual. NEGATIVE  MDMA, Urine Qual. NEGATIVE  Cocaine Metabolite, Urine Qual. NEGATIVE  Opiate, Urine qual NEGATIVE  Phencyclidine, Urine Qual. NEGATIVE  Cannabinoid, Urine Qual. NEGATIVE  Barbiturates, Urine Qual. NEGATIVE  Benzodiazepine, Urine Qual. NEGATIVE (----------------- The URINE DRUG SCREEN provides only a preliminary, unconfirmed analytical test result and should not be used for non-medical  purposes.  Clinical consideration and professional judgment should be  applied to any positive drug screen result due to possible interfering substances.  A more specific alternate chemical method must be used in order to obtain a confirmed analytical result.  Gas chromatography/mass spectrometry (GC/MS) is the preferred confirmatory method.)  Methadone, Urine Qual. NEGATIVE  Cardiac:  20-Aug-14  05:00   CK, Total  421  CPK-MB, Serum 2.1 (Result(s) reported on 20 Dec 2012 at 05:56AM.)  Troponin I < 0.02 (0.00-0.05 0.05 ng/mL or less: NEGATIVE  Repeat testing in 3-6 hrs  if clinically indicated. >0.05 ng/mL: POTENTIAL  MYOCARDIAL INJURY. Repeat  testing in 3-6 hrs if  clinically indicated. NOTE: An increase or decrease  of 30% or more on serial  testing suggests a  clinically important change)  Routine UA:  19-Aug-14 11:43   Color (UA) Yellow  Clarity (UA) Turbid  Glucose (UA) 50 mg/dL  Bilirubin (UA) Negative  Ketones (UA) 1+  Specific Gravity (UA) 1.019  Blood (UA) 3+  pH (UA) 5.0  Protein (UA) >=500  Nitrite (UA) Negative  Leukocyte Esterase (UA) Negative (Result(s) reported on 19 Dec 2012 at 12:03PM.)  RBC (UA) 2675 /HPF  WBC (UA) 84 /HPF  Bacteria (UA) 3+  Epithelial Cells (UA) NONE SEEN  WBC Clump (UA) PRESENT  Mucous (UA) PRESENT (Result(s) reported on 19 Dec 2012 at 12:03PM.)   Routine Coag:  19-Aug-14 10:33   Prothrombin  17.5  INR 1.4 (INR reference interval applies to patients on anticoagulant therapy. A single INR therapeutic range for coumarins is not optimal for all indications; however, the suggested range for most indications is 2.0 - 3.0. Exceptions to the INR Reference Range may include: Prosthetic heart valves, acute myocardial infarction, prevention of myocardial infarction, and combinations of aspirin and anticoagulant. The need for a higher or lower target INR must be assessed individually. Reference: The Pharmacology and Management of the Vitamin K  antagonists: the seventh ACCP Conference on Antithrombotic and Thrombolytic Therapy. GEZMO.2947 Sept:126 (3suppl): [id number]. A HCT value >55% may artifactually increase the PT.  In one study,  the increase was an average of 25%. Reference:  "Effect on Routine and Special Coagulation Testing Values of Citrate Anticoagulant Adjustment in Patients with High HCT Values." American Journal of Clinical Pathology 2006;126:400-405.)  Activated PTT (APTT)  36.6 (A HCT value >55% may artifactually increase the APTT. In one study, the increase was an average of 19%. Reference: "Effect on Routine and Special Coagulation Testing Values of Citrate Anticoagulant Adjustment in Patients with High HCT Values." American Journal of Clinical Pathology 2006;126:400-405.)  Routine Hem:  04-Sep-14 07:35   WBC (CBC) 10.4  RBC (CBC)  4.27  Hemoglobin (CBC)  11.5  Hematocrit (CBC)  35.7  Platelet Count (CBC)  447  MCV 84  MCH 26.9  MCHC 32.2  RDW  22.0  Neutrophil % 74.9  Lymphocyte % 9.9  Monocyte % 11.1  Eosinophil % 2.7  Basophil % 1.4  Neutrophil #  7.8  Lymphocyte # 1.0  Monocyte #  1.1  Eosinophil # 0.3  Basophil # 0.1 (Result(s) reported on 04 Jan 2013 at 07:50AM.)   Impression/Recommendations: Recommendations:   54 year old man with a complex medical history as outlined below currently with AMS.   this patient's exam strikes me as being internally inconsistent.  The only deficit is that he does not know orientation facts like dates.  Otherwise his mental status exam is normal.  Excellent attention and concentration, asks how I am doing today, follows all commands quickly, efficiently and correctly.  Requires cajoling and motivation to engage but once committed to engagement does a good job.  Possibly suffering from an element of metabolic encephalopathy but labs now do not appear significantly different from his baseline.  I suspect an effort component, whether or not this is a small or large component  of his AMS remains to be seen.  I would rec a routine EEG to evaluate his brain waves, if he is significantly altered I would expect to see diffuse slowing with or without triphasic waves, if he is having normal level of cognition with poor effort then I would expect to see reactive alpha background.   EEG  Electronic Signatures: Anabel Bene (MD)  (Signed 05-Sep-14 17:23)  Authored: REFERRING PHYSICIAN, Primary Care Physician, Consult, History of Present Illness, Review of Systems, PAST MEDICAL/SURGICAL HISTORY, HOME MEDICATIONS, Current Medications, ALLERGIES, NURSING VITAL SIGNS, Physical Exam-, LAB RESULTS, Recommendations   Last Updated: 05-Sep-14 17:23 by Anabel Bene (MD)

## 2014-08-23 NOTE — Op Note (Signed)
PATIENT NAME:  [name] [name], [name] [name] MR#:  [id number] DATE OF BIRTH:  21-Feb-1961  DATE OF PROCEDURE:  12/19/2012  PREOPERATIVE DIAGNOSES: 1.  Poor venous access.  2.  Respiratory failure requiring intubation.  3.  End-stage renal disease.   POSTOPERATIVE DIAGNOSES:  1.  Poor venous access.  2.  Respiratory failure requiring intubation.  3.  End-stage renal disease.   PROCEDURES: 1.  Ultrasound guidance for vascular access, right jugular vein.  2.  Placement of right jugular triple-lumen catheter.   SURGEON: Annice NeedyJason S. Dew, MD   ANESTHESIA: Local.   ESTIMATED BLOOD LOSS: Minimal.   INDICATION FOR PROCEDURE: This is a 54 year old dialysis patient who apparently missed his dialysis treatments the last 2 to 3 visits. He presents with multiple issues, including respiratory failure. He is now intubated and sedated on the ventilator and has a very tenuous venous access situation and we are asked to place a central line.   DESCRIPTION OF PROCEDURE: The patient is laid flat in the critical care bed. The right neck  was sterilely prepped and draped and a sterile surgical field was created. The right jugular vein was visualized with ultrasound and found to be widely patent. It was then accessed under direct ultrasound guidance without difficulty with a Seldinger needle. A J-wire was placed. After skin nick and dilatation, the triple lumen was placed over the wire and the wire was removed. It was secured to the skin at 19 cm with 3 silk sutures. All three lumens withdrew blood well and flushed easily with sterile saline. A stat chest x-ray is pending.    ____________________________ Annice NeedyJason S. Dew, MD jsd:cc D: 12/19/2012 17:29:11 ET T: 12/19/2012 22:07:29 ET JOB#: [id number]  cc: Annice NeedyJason S. Dew, MD, <Dictator> Annice NeedyJASON S DEW MD ELECTRONICALLY SIGNED 01/03/2013 16:13

## 2014-08-23 NOTE — Op Note (Signed)
PATIENT NAME:  [name] [name], [name] MR#:  [id number] DATE OF BIRTH:  1960-09-19  DATE OF PROCEDURE:  09/22/2012  PREOPERATIVE DIAGNOSES:  1.  End-stage renal disease.  2.  Nonfunctional left jugular PermCath.  3.  Hypertension.   POSTOPERATIVE DIAGNOSES:  1.  End-stage renal disease.  2.  Nonfunctional left jugular PermCath.  3.  Hypertension.   PROCEDURE PERFORMED:  Removal and replacement through the same venous access with a 27-cm tip to cuff tunneled hemodialysis catheter.   SURGEON:  Annice NeedyJason S. Taylie Helder, MD  ANESTHESIA:  Local with moderate conscious sedation.   ESTIMATED BLOOD LOSS:  25 mL.  FLUOROSCOPY TIME:  Approximately one minute.   CONTRAST USED:  None.   INDICATION FOR PROCEDURE:  A 54 year old white male who is reasonably new to dialysis. His left jugular PermCath has not working and he needs this replaced. He had a fistula placed earlier this week, but this was not going to be ready for use for well over a month. The risks and benefits were discussed and informed consent was obtained.   DESCRIPTION OF PROCEDURE:  The patient is brought to the Vascular and Interventional Radiology Suite. The existing catheter and left chest were sterilely prepped and draped and a sterile surgical field was created. The existing catheter was rewired with an Amplatz superstiff wire, and the area around the catheter was locally anesthetized with 1% lidocaine. The existing catheter was removed after dissecting this out with a curved hemostat without difficulty. A 27-cm tip to cuff tunneled hemodialysis catheter was then placed and parked in the right atrium. This is a DuraMax catheter. The wire was removed. Both lumens withdrew blood well and flushed easily with heparinized saline. They were secured to the skin with 2 Prolene sutures and a 4-0 Monocryl purse suture was placed around the catheter exit site. The patient tolerated the procedure well and was taken to the Recovery Room in stable condition.    ____________________________ Annice NeedyJason S. Ashle Stief, MD jsd:jm D: 09/22/2012 12:50:49 ET T: 09/22/2012 13:14:50 ET JOB#: [id number]  cc: Annice NeedyJason S. Srihan Brutus, MD, <Dictator> Annice NeedyJASON S Keylor Rands MD ELECTRONICALLY SIGNED 09/27/2012 13:52

## 2014-08-23 NOTE — Consult Note (Signed)
Brief Consult Note: Diagnosis: Altered mental status, Delirium.   Orders entered.   Comments: [name] apparently has no psychiatric past. He was out of his room 159 today and I was unable to see him. per staff he is delirious and confused.  PLAN: 1. Delirium is a medical emergency. please treat underlying causes.   2. Will start low dose Risperdal for delirium.  3. I will follow along.  Electronic Signatures: Kristine LineaPucilowska, Tempest Frankland (MD)  (Signed 18-Sep-14 13:11)  Authored: Brief Consult Note   Last Updated: 18-Sep-14 13:11 by Kristine LineaPucilowska, Ariea Rochin (MD)

## 2014-08-23 NOTE — Discharge Summary (Signed)
PATIENT NAME:  [name] [name], Xzavier MR#:  [id number] DATE OF BIRTH:  [birth year]  DATE OF ADMISSION:  12/19/2012 DATE OF DISCHARGE:  01/10/2013  DISCHARGE DIAGNOSES:  1.  Metabolic encephalopathy, resolved.  2.  End-stage renal disease, on hemodialysis Tuesday, Thursday, Saturday.  3.  Respiratory failure requiring intubation.  4.  Pneumonia, resolved.  5.  Adult-onset diabetes.  6.  Anemia of chronic disease  7.  Altered mental status, underlying psychiatric disorder, not otherwise specified.   DISCHARGE MEDICATIONS:  1.  Gabapentin 300 mg p.o. b.i.d.  2.  Mirtazapine 15 mg p.o. daily at bedtime.  3.  Glipizide 10 mg p.o. b.i.d. Please hold if CBGs are less than 120 prior to administering.  4.  Sevelamer 2.4 grams 3 times a day with meals.  5.  Losartan 25 mg p.o. daily.   CONSULTS: Pulmonology, palliative care, renal, psychiatry and neurology.   PROCEDURE: The patient underwent a bronchoscopy on 08/22 that showed purulent discharge. Culture was obtained. The patient was extubated twice on 08/21 and 08/29.   PERITENT LABS: Prior to discharge sodium had been 134, potassium 3.4, creatinine 4.69. White blood cell count 10.3, hemoglobin 11.3 and platelets 281.   BRIEF HOSPITAL COURSE:  1.  Metabolic encephalopathy. The patient initially came in, found unresponsive after missing two sessions of hemodialysis and it was thought that he had encephalopathy for multifocal reasons, likely from missing dialysis and also from infection. The patient was unable to protect his airway therefore, he was admitted to the CCU, was intubated and was found to have pneumonia and was treated as such with antibiotics. Upon attempts for extubation, the patient failed the initial extubation on 08/21, which needed to be re-intubated on 08/22. A bronchoscopy was performed at that time. He remained on the vent until 08/29, where he was extubated and remained off the vent. The patient developed some complications after that.  He refused to be cooperative with the staff and refused to pass a swallow study and eat. Palliative care was consulted. The patient eventually was evaluated by psychiatry, who deemed that patient did not have the capacity to make medical decisions. On the day of discharge, he was back to his baseline state and was interactive and was cooperative and was tolerating his food and was willing to get up to the bedside commode. Foley was removed at that time. He was evaluated by PT who did recommend further rehab. He is going to go to a skilled nursing facility where he is going to get further physical therapy. He will probably need nutrition evaluation at some point during that stay. He will continue with his hemodialysis as scheduled Tuesday, Thursday, Saturday. He will likely need further psychiatric evaluation as an outpatient to determine appropriate treatment.   DISPOSITION: He is in stable condition and will be discharged to a skilled nursing facility and he is to follow up with Dr. Burnadette PopLinthavong upon discharge from the skilled nursing facility and remain on his hemodialysis schedule Tuesday, Thursday, and Saturday.  ____________________________ Marisue IvanKanhka Mckena Chern, MD kl:aw D: 01/10/2013 07:35:51 ET T: 01/10/2013 07:47:37 ET JOB#: [id number]  cc: Marisue IvanKanhka Kenderick Kobler, MD, <Dictator> Marisue IvanKANHKA Maekayla Giorgio MD ELECTRONICALLY SIGNED 01/22/2013 10:48
